# Patient Record
Sex: Male | Born: 1990 | Race: White | Hispanic: No | Marital: Single | Smoking: Current every day smoker
Health system: Southern US, Community
[De-identification: ages and names within clinical notes are randomized; demographics above are authoritative.]

## PROBLEM LIST (undated history)

## (undated) DIAGNOSIS — F411 Generalized anxiety disorder: Principal | ICD-10-CM

## (undated) DIAGNOSIS — F329 Major depressive disorder, single episode, unspecified: Secondary | ICD-10-CM

## (undated) HISTORY — DX: Generalized anxiety disorder: F41.1

## (undated) HISTORY — PX: TYMPANOSTOMY TUBE PLACEMENT: SHX32

## (undated) HISTORY — DX: Major depressive disorder, single episode, unspecified: F32.9

## (undated) HISTORY — PX: MELANOMA EXCISION: SHX5266

## (undated) NOTE — Progress Notes (Signed)
 Formatting of this note might be different from the original. Demographics, pertinent lab result, patient disposition shared by secure email with [facility] regarding reportable disease or condition, COVID-19 Diane Catherine Pomarico, RN, 12/22/2020, 11:07    Electronically signed by Diane Catherine Pomarico, RN at 12/22/2020 11:07 AM EST

## (undated) NOTE — ED Provider Notes (Signed)
 Formatting of this note is different from the original. EMERGENCY DEPARTMENT ENCOUNTER    Patient was evaluated in the emergency department today for the symptoms described in the HPI.  He/she was evaluated in the context of the global COVID-19 pandemic, which necessitated consideration that the patient might be at risk for infection with the SARS-CoV-2 virus that causes COVID-19.  Institutional protocols and algorithms that pertain to the evaluation of patients at risk for COVID-19 are in a state of rapid change based on information released by regulatory bodies including the CDC and federal and state organizations.  These policies and algorithms were followed during the patient's care in the ED.  CHIEF COMPLAINT   Chief Complaint  Patient presents with  ? Emesis    Reports N/V x2 wks.  Reports anxiety attack this AM.  Received ativan by EMS.  Reports unable to keep down trazadone.   HPI   Brian Dougherty is a 31 y.o. male who presents intermittent nausea bone for 2 refill associated with diffuse myalgias  This morning patient thinks he had a panic attack difficulty breathing, received Ativan feeling improved  Patient usually takes trazodone at night to sleep, has been unable tolerate this has not been sleeping well  Girlfriend just tested positive for COVID  PAST MEDICAL HISTORY   Past Medical History:  Diagnosis Date  ? Anxiety   ? Concussion   ? Depression   ? Insomnia   ? Melanoma [facility])    SURGICAL HISTORY   Past Surgical History:  Procedure Laterality Date  ? SKIN BIOPSY     CURRENT MEDICATIONS   No current facility-administered medications for this encounter.   Current Outpatient Medications  Medication Sig Dispense Refill  ? ondansetron (ZOFRAN-ODT) 4 MG disintegrating tablet Take 1 tablet (4 mg total) by mouth every 8 (eight) hours as needed for Nausea for up to 7 days. 20 tablet 0   ALLERGIES   Allergies  Allergen Reactions  ? Cefazolin Rash   FAMILY HISTORY    History reviewed. No pertinent family history.  SOCIAL HISTORY   Social History   Socioeconomic History  ? Marital status: Single    Spouse name: None  ? Number of children: None  ? Years of education: None  ? Highest education level: None  Occupational History  ? None  Tobacco Use  ? Smoking status: Current Every Day Smoker    Packs/day: 1.00  ? Smokeless tobacco: Never Used  Vaping Use  ? Vaping Use: Never used  Substance and Sexual Activity  ? Alcohol use: Yes    Comment: 1 drink a week  ? Drug use: Yes    Types: Marijuana  ? Sexual activity: None  Other Topics Concern  ? None  Social History Narrative  ? None   Social Determinants of Health   Financial Resource Strain: Not on file  Food Insecurity: Not on file  Transportation Needs: Not on file  Social Connections: Not on file   REVIEW OF SYSTEMS   Review of Systems  Constitutional: Negative for fever.  HENT: Negative.   Eyes: Negative.   Respiratory: Negative.   Cardiovascular: Negative.   Gastrointestinal: Positive for nausea and vomiting.  Genitourinary: Negative.   Musculoskeletal: Positive for myalgias.  Skin: Negative.   Neurological: Negative.     See HPI for further details.  PHYSICAL EXAM   VITAL SIGNS: BP 112/65   Pulse 99   Temp 98.9 F (37.2 C) (Oral)   Resp 20   Ht 6'  6 (1.981 m)   Wt 160 lb (72.6 kg)   SpO2 97%   BMI 18.49 kg/m  Constitutional:  Well developed, well nourished, no acute distress, non-toxic appearance.  Head: Atraumatic, Normocephalic Eyes:  Conjunctiva normal, no discharge ENT: surgical mask in place Neck:Normal inspection, normal range of motion,  no meningeal signs,  Respiratory:  Symmetrical chest rise, no respiratory distress,  Abdomen:   Normal abdomen exam, Soft, No tenderness, No masses, No pulsatile masses. No rebound or organomegly. Musculoskeletal/Extremities:  No edema, no cyanosis, no clubbing. Good range of motion in all major joints. No major  deformities noted.  Skin:  Warm, dry, no erythema, no rash.  Neurologic:  Awake and alert, gross normal cognition No focal deficits noted. Shoulder shrug, neck, face and tongue movement grossly normal. Psych:Appropriate affect, alert and oriented to person, place and time  GCS: 15  PROCEDURES    EKG ECG Results     ECG 12 lead (Final result)  Component (Lab Inquiry)    Collection Time Result Time Ventricular Rate Atrial Rate P-R Interval QRS Duration Q-T Interval   12/21/20 08:40:17 12/21/20 10:01:36 105 105 126 86 386    Collection Time Result Time QTC Calculation(Bezet) Calculated P Axis Calculated R Axis Calculated T Axis   12/21/20 08:40:17 12/21/20 10:01:36 510 54 75 56      Final result       Narrative:   Sinus tachycardia Otherwise normal ECG No previous ECGs available Confirmed by Wiliam Cough ([id number]) on 12/21/2020 10:01:34 AM            RADIOLOGY   Reviewed by me. See official radiology interpretation.  No orders to display   LABS  Results for orders placed or performed during the hospital encounter of 12/21/20  Respiratory Panel   Specimen: Nasopharyngeal Swab  Result Value Ref Range   Adenovirus Not Detected Not Detected   Coronavirus 229E Not Detected Not Detected   Coronavirus HKU1 Not Detected Not Detected   Coronavirus NL63 Not Detected Not Detected   Coronavirus OC43 Not Detected Not Detected   Human Metapneumovirus Not Detected Not Detected   Human Rhinovirus/Enterovirus Not Detected Not Detected   Parainfluenza Virus 1 Not Detected Not Detected   Parainfluenza Virus 2 Not Detected Not Detected   Parainfluenza Virus 3 Not Detected Not Detected   Parainfluenza Virus 4 Not Detected Not Detected   Bordetella parapertussis (IS 1001) Not Detected Not Detected   Bordetella pertussis Not Detected Not Detected   Chlamydia pneumoniae Not Detected Not Detected   Mycoplasma pneumoniae Not Detected Not Detected   Respiratory Syncytial Virus (RSV)  Not Detected Not Detected  SARS-CoV-2, Influenza A,B, RSV   Specimen: Nasopharyngeal Swab  Result Value Ref Range   SARS-CoV-2 Positive (A) Negative   SARS CoV-2 CT Value 35.1    Influenza A Negative Negative   Influenza B Negative Negative   Respiratory Syncytial Virus (RSV) Negative Negative  Comprehensive Metabolic Profile(CMP): Na, K, Cl, CO2, Creat, GFR, BUN, Glu, Ca, Alb, Tprot, Tbili, ALT, AST, ALP  Result Value Ref Range   Sodium 137 136 - 145 mmol/L   Potassium 3.2 (L) 3.4 - 5.1 mmol/L   Chloride 96 (L) 98 - 107 mmol/L   Carbon Dioxide 21.0 20.0 - 31.0 mmol/L   Anion Gap 20    Creatinine 0.86 0.70 - 1.30 mg/dL   GFR, Estimated >=39 fO/fpw/8.26f7   Urea Nitrogen, Blood 7 (L) 9 - 23 mg/dL   Glucose 82 74 - 893 mg/dL  Calcium 9.4 8.7 - 10.4 mg/dL   Albumin 4.2 3.4 - 5.0 g/dL   Total Protein 7.2 5.7 - 8.2 g/dL   Bilirubin, Total 1.1 0.3 - 1.2 mg/dL   ALT/SGPT 99 (H) 10 - 49 U/L   AST/SGOT 391 (H) <34 U/L   Alkaline Phosphatase 127 (H) 46 - 116 U/L  CBC with Auto Differential  Result Value Ref Range   WBC Count 11.5 (H) 4.0 - 10.0 K/uL   RBC Count 4.30 (L) 4.63 - 6.08 M/uL   Hemoglobin 14.6 13.7 - 17.5 g/dL   Hematocrit 58.2 59.8 - 51.0 %   MCV 97.0 (H) 79.0 - 92.2 fL   MCH 34.0 (H) 25.6 - 32.2 pg   MCHC 35.0 32.2 - 36.5 g/dL   RDW 86.9 88.3 - 85.5 %   Platelets 294 163 - 369 K/uL   MPV 11.2 9.4 - 12.4 fL   Abs NRBC 0.01 0.00 - 0.01 K/uL   Neutrophils 75.9 %   Lymphocytes 10.8 %   Monocytes 11.8 %   Eosinophils 0.7 %   Basophils 0.5 %   Immature Grans 0.3 %   NRBC 0.0 0.0 - 0.2 per 100 WBC's   Abs Neutrophils 8.72 (H) 1.60 - 6.10 K/uL   Abs Lymphs 1.24 1.20 - 3.70 K/uL   Abs Monos 1.36 (H) 0.20 - 0.80 K/uL   Abs Eosinophils 0.08 0.00 - 0.50 K/uL   Abs Basophils 0.06 0.00 - 0.10 K/uL   Abs Immature Grans 0.04 (H) 0.00 - 0.03 K/uL   ANC 8.72 K/uL   Differential Type Auto   ECG 12 lead  Result Value Ref Range   Ventricular Rate 105 BPM   Atrial Rate 105 BPM    P-R Interval 126 ms   QRS Duration 86 ms   Q-T Interval 386 ms   QTC Calculation(Bezet) 510 ms   Calculated P Axis 54 degrees   Calculated R Axis 75 degrees   Calculated T Axis 56 degrees   ORDERS  Orders Placed This Encounter  Procedures  ? Respiratory Panel  ? SARS-CoV-2, Influenza A,B, RSV  ? Extra Tubes  ? RED TOP HOLD  ? LAVENDER TOP HOLD  ? PINK TOP HOLD  ? GOLD TOP HOLD  ? DARK GREEN TOP HOLD  ? LIGHT BLUE TOP HOLD  ? LIGHT GREEN TOP HOLD  ? LIGHT GREEN TOP HOLD  ? CBC with Differential  ? Comprehensive Metabolic Profile(CMP): Na, K, Cl, CO2, Creat, GFR, BUN, Glu, Ca, Alb, Tprot, Tbili, ALT, AST, ALP  ? CBC with Auto Differential  ? Airborne Isolation + Contact 2  ? ECG 12 lead   Orders Placed This Encounter  Medications  ? sodium chloride (NS BOLUS) 0.9 % IV bolus 1,000 mL  ? droperidoL (Inapsine) injection 2.5 mg  ? sodium chloride (NS BOLUS) 0.9 % IV bolus 1,000 mL  ? ketorolac (TORADOL) 30 mg/mL (1 mL) injection 15 mg  ? ondansetron (ZOFRAN-ODT) 4 MG disintegrating tablet    Sig: Take 1 tablet (4 mg total) by mouth every 8 (eight) hours as needed for Nausea for up to 7 days.    Dispense:  20 tablet    Refill:  0   HEARTSCORE:    CC Time:  Total critical care time between repeat bedside evaluation diagnosis, interpretation of vital signs and results, treatment, management, and disposition of this patient, subtracting time for procedures was 0 minutes.  ED COURSE & MEDICAL DECISION MAKING  Pertinent labs & imaging studies reviewed by  myself. (See chart for details)  If present most recent medical visit reviewed in chart.  VITALS: BP 112/65   Pulse 99   Temp 98.9 F (37.2 C) (Oral)   Resp 20   Ht 6' 6 (1.981 m)   Wt 160 lb (72.6 kg)   SpO2 97%   BMI 18.49 kg/m   Well-appearing man no acute distress fever symptoms COVID  Extensive lab workup reassuring, patient is COVID positive Will be discharged home with antiemetics  Follow up family  doctor  Return if anything changes  FINAL IMPRESSION   Final diagnoses:  COVID-19   Portions of this note may be dictated using digital voice recognition software.  Variances in spelling and vocabulary are possible and unintentional.  Not all errors are caught/corrected.  Please notify the dino if any discrepancies are noted or if the meaning of any statement is not clear.    Donnice SQUIBB. Wiliam, DO 12/21/20 1002  Electronically signed by Donnice SQUIBB. Geib, DO at 12/21/2020 10:02 AM EST

## (undated) NOTE — ED Notes (Signed)
 Formatting of this note might be different from the original. Teaching provided regarding plan of care and pain scale - Verbalized understanding. Electronically signed by Reyes Tod Manna, RN at 12/21/2020  8:10 AM EST

---
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
## 2011-01-06 ENCOUNTER — Encounter: Payer: Self-pay | Admitting: Family Medicine

## 2011-01-17 ENCOUNTER — Encounter: Payer: Self-pay | Admitting: Neurology

## 2011-01-28 NOTE — Letter (Signed)
Summary: Consult Scheduled [facility] at [facility]  [address] [place], [place] [address]   Phone: [phone]  Fax: [phone]    10/20/2010 MRN: [id number]    Dear Mr. COWDREY,      We have scheduled an appointment for you.  At the recommendation of Dr.robert Hetty Ely, we have scheduled you a consult with Dr Sherryll Burger on Wednesday, January 11th at 2:45pm at [facility].  Their phone number is [phone].  If this appointment day and time is not convenient for you, please feel free to call the office of the doctor you are being referred to at the number listed above and reschedule the appointment.  If you have any questions, please call and speak with Carlton Everado at [phone].   Thank you,  Cyndi Bender at [facility]

## 2011-01-28 NOTE — Letter (Signed)
Summary: Dr.Shah,Neurology-Pt didn't show for appt.  Dr.Shah,Neurology-Pt didn't show for appt.   Imported By: Beau Fanny 01/07/2011 16:21:10  _____________________________________________________________________  External Attachment:    Type:   Image     Comment:   External Document

## 2011-01-28 NOTE — Progress Notes (Signed)
Summary: wants referral to neurologist  Phone Note Call from Patient   Caller: Mom  Royden Purl [phone] Summary of Call: Pt's mother says pt has been seeing the same headache specialist for years and would like to change.  She would like referral for him to see Dr Clelia Croft, a neurologist in [place].  Appt needs to be on a monday, wednesday or friday. Initial call taken by: Lowella Petties CMA,  October 13, 2010 8:48 AM  Follow-up for Phone Call        Done. Follow-up by: Shaune Leeks MD,  October 14, 2010 7:34 AM  Additional Follow-up for Phone Call Additional follow up Details #1::        Appt Dr Sherryll Burger on 01/06/2011 at 2:45pm. Carlton Thurman  October 16, 2010 10:59 AM  Additional Follow-up by: Carlton Haig,  October 16, 2010 10:59 AM

## 2011-01-28 NOTE — Consult Note (Signed)
Summary: Dr.Eliot Lewit,[facility],Note  Dr.Eliot Lewit,[facility],Note   Imported By: Beau Fanny 01/19/2010 16:42:45  _____________________________________________________________________  External Attachment:    Type:   Image     Comment:   External Document

## 2011-01-28 NOTE — Letter (Signed)
Summary: Nadara Eaton letter  [place] at [facility]  [address] [place], [place] [address]   Phone: [phone]  Fax: [phone]       07/30/2010 MRN: [id number]  [place] [address] [place], [place]  [address]  Dear Mr. [facility] and [facility] announce the retirement of Arta Silence, M.D., from full-time practice at the [facility] office effective June 25, 2010 and his plans of returning part-time.  It is important to Dr. Hetty Ely and to our practice that you understand that [facility] has seven physicians in our office for your health care needs.  We will continue to offer the same exceptional care that you have today.    Dr. Hetty Ely has spoken to many of you about his plans for retirement and returning part-time in the fall.   We will continue to work with you through the transition to schedule appointments for you in the office and meet the high standards that [facility] is committed to.   Again, it is with great pleasure that we share the news that Dr. Hetty Ely will return to [facility] in October of 2011 with a reduced schedule.    If you have any questions, or would like to request an appointment with one of our physicians, please call us at [phone] and press the option for Scheduling an appointment.  We take pleasure in providing you with excellent patient care and look forward to seeing you at your next office visit.  Our [facility] Physicians are:  Tillman Abide, M.D. Laurita Quint, M.D. Roxy Manns, M.D. Kerby Nora, M.D. Hannah Beat, M.D. Ruthe Mannan, M.D. We proudly welcomed Raechel Ache, M.D. and Eustaquio Boyden, M.D. to the practice in July/August 2011.  Sincerely,  [facility]

## 2011-01-28 NOTE — Assessment & Plan Note (Signed)
Summary: CPX / LFW   Vital Signs:  Patient profile:   20 year old male Height:      75.5 inches Weight:      136.25 pounds Temp:     98.3 degrees F oral Pulse rate:   60 / minute Pulse rhythm:   regular BP sitting:   100 / 70  (left arm) Cuff size:   regular  Vitals Entered By: Sydell Axon LPN (June 18, 2010 2:01 PM) CC: 30 Minute checkup   History of Present Illness: Pt here for Comp Exam. He has no complaints and feels well. He feels like he has something in the back of the throat for a week or more.  Preventive Screening-Counseling & Management  Alcohol-Tobacco     Alcohol drinks/day: 0     Smoking Status: never  Caffeine-Diet-Exercise     Caffeine use/day: 3      Does Patient Exercise: yes     Type of exercise: gym, lifts weight     Exercise (avg: min/session): one hour     Times/week: 3  Problems Prior to Update: 1)  Routine General Medical Exam@health  Care Facl  (ICD-V70.0) 2)  Headache  (ICD-784.0) 3)  Fracture, Wrist, Left  (ICD-814.00)  Medications Prior to Update: 1)  Advil 200 Mg  Caps (Ibuprofen) .... As Needed  Allergies: 1)  ! Biaxin 2)  ! * Cefzil  Past History:  Past Surgical History: Last updated: 02/21/2008 TUBES AS CHILD :(1995) NO IMMUNIZATION RECORDS   Family History: Last updated: 06/18/2010 Father: A 83 Mother: A 40 Brother A 23  Social History: Last updated: 06/18/2010  Parents divorced when he was in early teens. Siblings: Brother older School name: Graduated from  [employer], [place] 2011 Looking for job.  Risk Factors: Alcohol Use: 0 (06/18/2010) Caffeine Use: 3  (06/18/2010) Exercise: yes (06/18/2010)  Risk Factors: Smoking Status: never (06/18/2010)  Family History: Father: A 3 Mother: A 25 Brother A 23  Social History:  Parents divorced when he was in early teens. Siblings: Brother older School name: Graduated from  [employer], [place] 2011 Looking for job.  Review of Systems General:  Denies fever,  chills, sweats, malaise, weight loss, and sleep disorder. Eyes:  Complains of photophobia; denies blurring, diplopia, and eye pain; at times when he has heaqdaches.. ENT:  Complains of tinnitus; denies earache and decreased hearing; occas. CV:  Denies chest pains, palpitations, peripheral edema, and syncope. Resp:  Denies cough, dyspnea at rest, and wheezing. GI:  Denies nausea, vomiting, diarrhea, constipation, change in bowel habits, abdominal pain, melena, hematochezia, indigestion/heartburn, and dysphagia. GU:  Denies dysuria, discharge, and urinary frequency. MS:  Complains of joint pain; denies back pain, muscle cramps, and stiffness. Derm:  Denies rash, itching, and dryness. Neuro:  Complains of frequent headaches; denies abnormal gait and tremors; better than previously.  Physical Exam  General:  Well appearing adolescent, no acute distress, very conversant and descriptive for his age. Nontoxic, smiling, quiet and slim. Head:  normocephalic and atraumatic  Sinuses NT. Eyes:  Conjunctiva clear bilaterally.  Ears:  TM's pearly gray with normal light reflex and landmarks, canals clear except for some cerumen in the right canal. Nose:  Clear without Rhinorrhea Mouth:  Clear without erythema, edema or exudate, mucous membranes moist, mild glistening thick-looking PND. Neck:  supple without adenopathy  Chest Wall:  no deformities or breast masses noted.   Lungs:  Clear to ausc, no crackles, rhonchi or wheezing, no grunting, flaring or retractions  Heart:  RRR without murmur  Abdomen:  no masses, organomegaly, or umbilical hernia Genitalia:  normal male, testes descended bilaterally without masses, Tanner V. Msk:  no deformity or scoliosis noted with normal posture and gait for age Pulses:  pulses normal in all 4 extremities Extremities:  no cyanosis or deformity noted with normal full range of motion of all joints Neurologic:  no focal deficits, CN II-XII grossly intact with normal  reflexes, coordination, muscle strength and tone Skin:  no rash seen. Cervical Nodes:  no significant adenopathy Inguinal Nodes:  no significant adenopathy Psych:  alert and cooperative; normal mood and affect; normal attention span and concentration    Impression & Recommendations:  Problem # 1:  ROUTINE GENERAL MEDICAL EXAM@HEALTH  CARE FACL (ICD-V70.0)  routine care and anticipatory guidance for age discussed to include sex, drugs, smoking and ETOH. Has been through class for marijuana to get it off his record. Get shot record at [facility] and bring in. Tdap today.  Orders: Est. Patient 12-17 years ([address])  Problem # 2:  HEADACHE (ICD-784.0) Assessment: Improved  Not as frequent.  His updated medication list for this problem includes:    Advil 200 Mg Caps (Ibuprofen) .Marland Kitchen... As needed    Propranolol Hcl 10 Mg Tabs (Propranolol hcl) .Marland Kitchen... Take 5 by mouth daily for headaches  Problem # 3:  FRACTURE, WRIST, LEFT (ICD-814.00) Assessment: Improved Resolved, aches at times. His updated medication list for this problem includes:    Advil 200 Mg Caps (Ibuprofen) .Marland Kitchen... As needed  Medications Added to Medication List This Visit: 1)  Propranolol Hcl 10 Mg Tabs (Propranolol hcl) .... Take 5 by mouth daily for headaches  Other Orders: Tdap => 1yrs IM [phone]) Admin 1st Vaccine (09811) Admin 1st Vaccine [facility] [id number])  Patient Instructions: 1)  Bring Shot record in.Marland KitchenMarland KitchenTdap today.(he does not remember getting one in the past)  Current Allergies (reviewed today): ! BIAXIN ! * CEFZIL   Tetanus/Td Vaccine    Vaccine Type: Tdap    Site: left deltoid    Mfr: GlaxoSmithKline    Dose: 0.5 ml    Route: IM    Given by: Sydell Axon LPN    Exp. Date: 03/19/2012    Lot #: [id number]    VIS given: 11/14/07 version given June 18, 2010.

## 2011-01-28 NOTE — Consult Note (Signed)
Summary: Dr.Eliot Lewit,[facility],Note  Dr.Eliot Lewit,[facility],Note   Imported By: Beau Fanny 03/03/2010 11:29:20  _____________________________________________________________________  External Attachment:    Type:   Image     Comment:   External Document

## 2011-10-12 ENCOUNTER — Encounter: Payer: Self-pay | Admitting: Family Medicine

## 2011-10-12 ENCOUNTER — Ambulatory Visit (INDEPENDENT_AMBULATORY_CARE_PROVIDER_SITE_OTHER): Payer: BC Managed Care – PPO | Admitting: Family Medicine

## 2011-10-12 DIAGNOSIS — L259 Unspecified contact dermatitis, unspecified cause: Secondary | ICD-10-CM

## 2011-10-12 DIAGNOSIS — L309 Dermatitis, unspecified: Secondary | ICD-10-CM | POA: Insufficient documentation

## 2011-10-12 MED ORDER — DOXYCYCLINE HYCLATE 100 MG PO TABS: 100.0000 mg | ORAL_TABLET | Freq: Two times a day (BID) | ORAL | Status: AC

## 2011-10-12 NOTE — Progress Notes (Signed)
  Subjective:    Patient ID: Brian Dougherty, male    DOB: 30-Jun-1991, 20 y.o.   MRN: [id number]  HPI Pt here as acute appt for rash. He has had a rash that started as a cold sore in the angle of the mouth on the left side. He then shaved and it spread to the right side of the mouth, again in the angle and now to the surrounding cheek and chi on the right side. He denies fever or chills but has been getting hot. He has some discomfort in the submental gland area on the right side. He otherwise feels well and has no furthwer complaints.   Review of SystemsNoncontributory except as above.       Objective:   Physical Exam  Constitutional: He appears well-developed and well-nourished. No distress.  HENT:  Head: Normocephalic and atraumatic.  Right Ear: External ear normal.  Left Ear: External ear normal.  Nose: Nose normal.  Mouth/Throat: Oropharynx is clear and moist.  Eyes: Conjunctivae and EOM are normal. Pupils are equal, round, and reactive to light. Right eye exhibits no discharge. Left eye exhibits no discharge.  Neck: Normal range of motion. Neck supple.  Cardiovascular: Normal rate and regular rhythm.   Pulmonary/Chest: Effort normal and breath sounds normal. He has no wheezes.  Lymphadenopathy:    He has no cervical adenopathy.  Skin: Skin is warm and dry. Rash (individual papular slightly vessicular with erythematous halo around each on the right lower cheek and right chin with vessicular lesion in the angle of the right lip, left side healled (where it originally began).) noted. He is not diaphoretic.          Assessment & Plan:

## 2011-10-12 NOTE — Assessment & Plan Note (Signed)
Presumably bacterial dermatitis/cellulitis. Will treat with Doxy. Is allergic to multiple Abs. Call if not completely gone with two weeks of medication.

## 2012-06-09 ENCOUNTER — Ambulatory Visit: Payer: BC Managed Care – PPO | Admitting: Family Medicine

## 2012-06-09 DIAGNOSIS — Z0289 Encounter for other administrative examinations: Secondary | ICD-10-CM

## 2012-09-20 ENCOUNTER — Encounter: Payer: Self-pay | Admitting: Family Medicine

## 2012-09-20 ENCOUNTER — Ambulatory Visit (INDEPENDENT_AMBULATORY_CARE_PROVIDER_SITE_OTHER): Payer: BC Managed Care – PPO | Admitting: Family Medicine

## 2012-09-20 VITALS — BP 125/85 | HR 70 | Temp 98.0°F | Ht 77.0 in | Wt 146.5 lb

## 2012-09-20 DIAGNOSIS — S6980XA Other specified injuries of unspecified wrist, hand and finger(s), initial encounter: Secondary | ICD-10-CM

## 2012-09-20 DIAGNOSIS — IMO0002 Reserved for concepts with insufficient information to code with codable children: Secondary | ICD-10-CM

## 2012-09-20 NOTE — Progress Notes (Signed)
   [facility] at [facility] [address] [place] [address] Phone: [phone] Fax: [phone]  Date:  09/20/2012   Name:  Brian Dougherty   DOB:  [birth year]   MRN:  [id number] Gender: male Age: 21 y.o.  PCP:  Hannah Beat, MD    Chief Complaint: Hand Pain   History of Present Illness:  Brian Dougherty is a 21 y.o. pleasant patient who presents with the following:  Pleasant gentleman, who traumatized his left thumbnail 2 weeks ago, now has had some pitting and irregularities at the bases nail. He is here in the office today, and curious about what to do to make it better. He also is curious to know this is an infection or not. No fever, chills or sweats. No chest pain, shortness of breath.  Otherwise review of systems as above.  Patient Active Problem List  Diagnosis  . HEADACHE  . FRACTURE, WRIST, LEFT  . Dermatitis    No past medical history on file.  Past Surgical History  Procedure Date  . Tympanostomy tube placement     History  Substance Use Topics  . Smoking status: Current Some Day Smoker    Types: Cigars  . Smokeless tobacco: Never Used  . Alcohol Use: No    No family history on file.  Allergies  Allergen Reactions  . Cefprozil   . Clarithromycin     Medication list has been reviewed and updated.  Outpatient Prescriptions Prior to Visit  Medication Sig Dispense Refill  . Ibuprofen (ADVIL) 200 MG CAPS Take by mouth as needed.          Physical Examination: Filed Vitals:   09/20/12 1548  BP: 125/85  Pulse: 70  Temp: 98 F (36.7 C)   Filed Vitals:   09/20/12 1548  Height: 6\' 5"  (1.956 m)  Weight: 146 lb 8 oz (66.452 kg)   Body mass index is 17.37 kg/(m^2). Ideal Body Weight: Weight in (lb) to have BMI = 25: 210.4    GEN: WDWN, NAD, Non-toxic, Alert & Oriented x 3 HEENT: Atraumatic, Normocephalic.  Ears and Nose: No external deformity. EXTR: No clubbing/cyanosis/edema, left thumbnail, age is pitted and has some  wave formation in appearance. NEURO: Normal gait.  PSYCH: Normally interactive. Conversant. Not depressed or anxious appearing.  Calm demeanor.    Assessment and Plan:  1. Nail bed injury    Probable nailbed injury, cannot exclude onychomycosis.  Recommended that he just follow this, and advised against any treatment or medication. If he has some worsening of it, he certainly could have some nail fungus, and he can followup in about 6 months.  Orders Today:  No orders of the defined types were placed in this encounter.    Updated Medication List: (Includes new medications, updates to list, dose adjustments) No orders of the defined types were placed in this encounter.    Medications Discontinued: There are no discontinued medications.   Hannah Beat, MD

## 2013-01-02 ENCOUNTER — Ambulatory Visit: Payer: BC Managed Care – PPO | Admitting: Family Medicine

## 2013-01-02 DIAGNOSIS — Z0289 Encounter for other administrative examinations: Secondary | ICD-10-CM

## 2013-01-24 ENCOUNTER — Encounter: Payer: Self-pay | Admitting: Family Medicine

## 2013-01-24 ENCOUNTER — Ambulatory Visit (INDEPENDENT_AMBULATORY_CARE_PROVIDER_SITE_OTHER): Payer: BC Managed Care – PPO | Admitting: Family Medicine

## 2013-01-24 VITALS — BP 120/72 | HR 97 | Temp 98.5°F | Ht 77.0 in | Wt 143.2 lb

## 2013-01-24 DIAGNOSIS — J111 Influenza due to unidentified influenza virus with other respiratory manifestations: Secondary | ICD-10-CM

## 2013-01-24 NOTE — Progress Notes (Signed)
   Patient Name: Brian Dougherty Date of Birth: 11-09-1991 Medical Record Number: [id number] Gender: male  History of Present Illness:  Brian Dougherty presents with runny nose, sneezing, cough, sore throat, malaise, myalgias, arthralgia, chills, and fever. Feeling hot and cold, sweating last night. Sore throat. Coughing. Hurting down in chest. Runny nose.  103 temperature this morning.   Whole body hurts.   ? recent exposure to others with similar symptoms.   The patent denies sore throat as the primary complaint. Denies sthortness of breath/wheezing, otalgia, facial pain, abdominal pain, changes in bowel or bladder.  Generally feels terrible  Tmax: 103  PMH, PHS, Allergies, Problem List, Medications, Family History, and Social History have all been reviewed.  Review of Systems: as above, eating and drinking - tolerating PO. Urinating normally. No excessive vomitting or diarrhea. O/w as above.  Physical Exam:  Filed Vitals:   01/24/13 1448  BP: 120/72  Pulse: 97  Temp: 98.5 F (36.9 C)  TempSrc: Oral  Height: 6\' 5"  (1.956 m)  Weight: 143 lb 4 oz (64.978 kg)  SpO2: 96%    Gen: WDWN, NAD; A & O x3, cooperative. Pleasant.Globally Non-toxic HEENT: Normocephalic and atraumatic. Throat clear, w/o exudate, R TM clear, L TM - good landmarks, No fluid present. rhinnorhea. No frontal or maxillary sinus T. MMM NECK: Anterior cervical  LAD is present anteriorly CV: RRR, No M/G/R, cap refill <2 sec PULM: Breathing comfortably in no respiratory distress. no wheezing, crackles, rhonchi ABD: S,NT,ND,+BS. No HSM. No rebound. EXT: No c/c/e PSYCH: Friendly, good eye contact MSK: Nml gait  Assessment and Plan: 1. Influenza: The patient's clinical exam and history is consistent with a diagnosis of influenza.  Supportive care. Fluids. Cough medicines as needed  Anti-pyretics.  Infection control emphasized, including OOW or school until AF 24 hours.

## 2013-04-04 ENCOUNTER — Encounter: Payer: Self-pay | Admitting: Family Medicine

## 2013-04-04 ENCOUNTER — Ambulatory Visit (INDEPENDENT_AMBULATORY_CARE_PROVIDER_SITE_OTHER): Payer: BC Managed Care – PPO | Admitting: Family Medicine

## 2013-04-04 VITALS — BP 110/60 | HR 65 | Temp 97.9°F | Ht 77.0 in | Wt 144.5 lb

## 2013-04-04 DIAGNOSIS — F419 Anxiety disorder, unspecified: Secondary | ICD-10-CM

## 2013-04-04 DIAGNOSIS — F341 Dysthymic disorder: Secondary | ICD-10-CM

## 2013-04-04 MED ORDER — FLUOXETINE HCL 20 MG PO TABS: 20.0000 mg | ORAL_TABLET | Freq: Every day | ORAL | Status: DC

## 2013-04-04 NOTE — Progress Notes (Signed)
[name] at [facility] [address] [place] [address] Phone: [phone] Fax: [phone]  Date:  04/04/2013   Name:  Brian Dougherty   DOB:  Apr 20, 1991   MRN:  [id number] Gender: male Age: 22 y.o.  Primary Physician:  Hannah Beat, MD  Evaluating MD: Hannah Beat, MD   Chief Complaint: Anxiety   History of Present Illness:  Brian Dougherty is a 22 y.o. pleasant patient who presents with the following:  Feels anxious all the time, not eating as much. Headaches almost all the time. Had maybe an anxiety attack, heart was beating really hard. About 4 days. Negative, side of things. Feels sad and down right now. 7 months.   Not sleeping much at all. Up til 4 AM. Just lost his job - to start [employer] this summer. No other drugs.  No SI or HI.  Decreased interest.   Facial congestion.   Patient Active Problem List  Diagnosis  . HEADACHE  . FRACTURE, WRIST, LEFT  . Dermatitis    No past medical history on file.  Past Surgical History  Procedure Laterality Date  . Tympanostomy tube placement      History   Social History  . Marital Status: Single    Spouse Name: N/A    Number of Children: N/A  . Years of Education: N/A   Occupational History  . Not on file.   Social History Main Topics  . Smoking status: Current Some Day Smoker    Types: Cigars  . Smokeless tobacco: Never Used  . Alcohol Use: No  . Drug Use: No  . Sexually Active: Not on file   Other Topics Concern  . Not on file   Social History Narrative   Parents divorced when he was in early teens   Older brother   Graduated from [employer], [place], 2011    No family history on file.  Allergies  Allergen Reactions  . Cefprozil   . Clarithromycin     Medication list has been reviewed and updated.  Outpatient Prescriptions Prior to Visit  Medication Sig Dispense Refill  . Ibuprofen (ADVIL) 200 MG CAPS Take by mouth as needed.         No facility-administered  medications prior to visit.    Review of Systems:   GEN: No acute illnesses, no fevers, chills. GI: No n/v/d, eating normally Pulm: No SOB Interactive and getting along well at home.  Otherwise, ROS is as per the HPI.   Physical Examination: BP 110/60  Pulse 65  Temp(Src) 97.9 F (36.6 C) (Oral)  Ht 6\' 5"  (1.956 m)  Wt 144 lb 8 oz (65.545 kg)  BMI 17.13 kg/m2  SpO2 97%  Ideal Body Weight: Weight in (lb) to have BMI = 25: 210.4   GEN: WDWN, NAD, Non-toxic, Alert & Oriented x 3 HEENT: Atraumatic, Normocephalic.  Ears and Nose: No external deformity. EXTR: No clubbing/cyanosis/edema NEURO: Normal gait.  PSYCH: Normally interactive. Conversant. Not depressed or anxious appearing.  Calm demeanor.    Assessment and Plan:  Anxiety and depression  >25 minutes spent in face to face time with patient, >50% spent in counselling or coordination of care: Not sleeping much at all. Up til 4 AM. Just lost his job - to start [employer] this summer. No other drugs.  No SI or HI.  Decreased interest.  Discussed aerobic exercise role Start SSRI No interest in counselling  Orders Today:  No orders of the defined types were  placed in this encounter.    Updated Medication List: (Includes new medications, updates to list, dose adjustments) Meds ordered this encounter  Medications  . FLUoxetine (PROZAC) 20 MG tablet    Sig: Take 1 tablet (20 mg total) by mouth daily.    Dispense:  30 tablet    Refill:  1    Medications Discontinued: Medications Discontinued During This Encounter  Medication Reason  . Ibuprofen (ADVIL) 200 MG CAPS Error      Signed, Damaree Sargent T. Julius Matus, MD 04/04/2013 10:27 AM

## 2013-04-04 NOTE — Patient Instructions (Addendum)
F/u Dr. Patsy Lager 1 month

## 2013-04-12 ENCOUNTER — Telehealth: Payer: Self-pay

## 2013-04-12 NOTE — Telephone Encounter (Signed)
Pt started Fluoxetine 20 mg taking one daily on 04/04/13; 3 days ago pt began feeling dizzy and difficulty focusing his eyes( when he would look at object he would see 2 instead of 1). Fluoxetine did not help anxious feeling and still having h/a on and off every day. Pt did not take Fluoxetine today and has had no dizziness or problem with vision. Pt request different med sent to [facility]Please advise.Pt request call back today.

## 2013-04-13 MED ORDER — CITALOPRAM HYDROBROMIDE 20 MG PO TABS: 20.0000 mg | ORAL_TABLET | Freq: Every day | ORAL | Status: DC

## 2013-04-13 NOTE — Telephone Encounter (Signed)
Patient advised and medication sent to pharmacy  

## 2013-04-13 NOTE — Telephone Encounter (Signed)
Please call.  I am not surprised Prozac did not help --- it always takes 3-4 weeks to start working. The vision problem is unusual. I have not heard of that before, but it is listed as a rare side effect, so should be from med.  D/c Prozac Start Celexa 20 mg, 1 po qhs. #30, 2 refills

## 2013-06-09 ENCOUNTER — Emergency Department: Payer: Self-pay | Admitting: Emergency Medicine

## 2013-06-09 LAB — URINALYSIS, COMPLETE
Bacteria: NONE SEEN
Bilirubin,UR: NEGATIVE
Blood: NEGATIVE
Glucose,UR: NEGATIVE mg/dL
Ketone: NEGATIVE
Leukocyte Esterase: NEGATIVE
Nitrite: NEGATIVE
Ph: 7
Protein: NEGATIVE
RBC,UR: NONE SEEN /HPF
Specific Gravity: 1.006
Squamous Epithelial: 1
WBC UR: 1 /HPF

## 2013-06-09 LAB — DRUG SCREEN, URINE
Amphetamines, Ur Screen: NEGATIVE
Barbiturates, Ur Screen: NEGATIVE
Benzodiazepine, Ur Scrn: POSITIVE
Cannabinoid 50 Ng, Ur ~~LOC~~: POSITIVE
Cocaine Metabolite,Ur ~~LOC~~: NEGATIVE
MDMA (Ecstasy)Ur Screen: NEGATIVE
Methadone, Ur Screen: NEGATIVE
Opiate, Ur Screen: NEGATIVE
Phencyclidine (PCP) Ur S: NEGATIVE
Tricyclic, Ur Screen: NEGATIVE

## 2013-06-09 LAB — COMPREHENSIVE METABOLIC PANEL WITH GFR
Albumin: 4.8 g/dL
Alkaline Phosphatase: 87 U/L
Anion Gap: 7
BUN: 13 mg/dL
Bilirubin,Total: 0.7 mg/dL
Calcium, Total: 9.3 mg/dL
Chloride: 105 mmol/L
Co2: 26 mmol/L
Creatinine: 0.95 mg/dL
EGFR (African American): >60
EGFR (Non-African Amer.): >60
Glucose: 88 mg/dL
Osmolality: 275
Potassium: 3.8 mmol/L
SGOT(AST): 27 U/L
SGPT (ALT): 17 U/L
Sodium: 138 mmol/L
Total Protein: 7.5 g/dL

## 2013-06-09 LAB — CBC
HCT: 43.2 % (ref 40.0–52.0)
MCH: 32.2 pg (ref 26.0–34.0)
MCV: 91 fL (ref 80–100)
RDW: 12.9 % (ref 11.5–14.5)

## 2013-06-09 LAB — ETHANOL
Ethanol %: <0.003 % (ref 0.000–0.080)
Ethanol: <3 mg/dL

## 2013-06-11 ENCOUNTER — Encounter: Payer: BC Managed Care – PPO | Admitting: Family Medicine

## 2013-06-11 ENCOUNTER — Encounter: Payer: Self-pay | Admitting: Family Medicine

## 2013-06-11 ENCOUNTER — Ambulatory Visit: Payer: BC Managed Care – PPO | Admitting: Family Medicine

## 2013-06-11 DIAGNOSIS — Z0289 Encounter for other administrative examinations: Secondary | ICD-10-CM

## 2013-06-11 NOTE — Progress Notes (Signed)
This encounter was created in error - please disregard.

## 2013-06-13 ENCOUNTER — Encounter: Payer: Self-pay | Admitting: Family Medicine

## 2013-06-13 ENCOUNTER — Ambulatory Visit (INDEPENDENT_AMBULATORY_CARE_PROVIDER_SITE_OTHER): Payer: BC Managed Care – PPO | Admitting: Family Medicine

## 2013-06-13 VITALS — BP 94/64 | HR 68 | Temp 98.4°F | Wt 142.5 lb

## 2013-06-13 DIAGNOSIS — F411 Generalized anxiety disorder: Secondary | ICD-10-CM

## 2013-06-13 DIAGNOSIS — F329 Major depressive disorder, single episode, unspecified: Secondary | ICD-10-CM | POA: Insufficient documentation

## 2013-06-13 DIAGNOSIS — F4323 Adjustment disorder with mixed anxiety and depressed mood: Secondary | ICD-10-CM | POA: Insufficient documentation

## 2013-06-13 DIAGNOSIS — F32A Depression, unspecified: Secondary | ICD-10-CM

## 2013-06-13 HISTORY — DX: Generalized anxiety disorder: F41.1

## 2013-06-13 HISTORY — DX: Depression, unspecified: F32.A

## 2013-06-13 MED ORDER — CITALOPRAM HYDROBROMIDE 20 MG PO TABS: 20.0000 mg | ORAL_TABLET | Freq: Every day | ORAL | Status: DC

## 2013-06-13 NOTE — Patient Instructions (Addendum)
F/u 6 weeks or so

## 2013-06-13 NOTE — Progress Notes (Signed)
[facility] at [facility] [address] [place] [address] Phone: [phone] Fax: [phone]  Date:  06/13/2013   Name:  Brian Dougherty   DOB:  [birth year]   MRN:  [id number] Gender: male Age: 22 y.o.  Primary Physician:  Hannah Beat, MD  Evaluating MD: Hannah Beat, MD   Chief Complaint: Anxiety   History of Present Illness:  Brian Dougherty is a 22 y.o. pleasant patient who presents with the following:  F/u anxiety:   Had a bad panic attack. 2 this month. Took celexa for a month. Tolerated more.  Moved in with girlfriend. At girlfriend's parents house.  Feels a litle useless.  Mowing some yards.  Not sleeping much. Up at 8-9 AM, goes to bed 1 am usually.  Just started working out.   Patient Active Problem List   Diagnosis Date Noted  . Dermatitis 10/12/2011  . HEADACHE 12/23/2009  . FRACTURE, WRIST, LEFT 08/29/2007    No past medical history on file.  Past Surgical History  Procedure Laterality Date  . Tympanostomy tube placement      History   Social History  . Marital Status: Single    Spouse Name: N/A    Number of Children: N/A  . Years of Education: N/A   Occupational History  . Not on file.   Social History Main Topics  . Smoking status: Current Some Day Smoker -- 0.50 packs/day for 2 years    Types: Cigarettes, Cigars  . Smokeless tobacco: Former Neurosurgeon  . Alcohol Use: Yes     Comment: occassionally  . Drug Use: No  . Sexually Active: Not on file   Other Topics Concern  . Not on file   Social History Narrative   Parents divorced when he was in early teens   Older brother   Graduated from [employer], [place], 2011    No family history on file.  Allergies  Allergen Reactions  . Cefprozil   . Clarithromycin     Medication list has been reviewed and updated.  Outpatient Prescriptions Prior to Visit  Medication Sig Dispense Refill  . citalopram (CELEXA) 20 MG tablet Take 1 tablet (20 mg total) by mouth  at bedtime.  30 tablet  2   No facility-administered medications prior to visit.    Review of Systems:   GEN: No acute illnesses, no fevers, chills. GI: No n/v/d, eating normally Pulm: No SOB Interactive and getting along well at home.  Otherwise, ROS is as per the HPI.   Physical Examination: BP 94/64  Pulse 68  Temp(Src) 98.4 F (36.9 C) (Oral)  Wt 142 lb 8 oz (64.638 kg)  BMI 16.89 kg/m2  Ideal Body Weight:     GEN: WDWN, NAD, Non-toxic, Alert & Oriented x 3 HEENT: Atraumatic, Normocephalic.  Ears and Nose: No external deformity. EXTR: No clubbing/cyanosis/edema NEURO: Normal gait.  PSYCH: Normally interactive. Conversant. Flat affect.   Assessment and Plan:  Anxiety state, unspecified  Depression  >15 minutes spent in face to face time with patient, >50% spent in counselling or coordination of care: Had a bad panic attack. 2 this month. Took celexa for a month. Tolerated more.  Moved in with girlfriend. At girlfriend's parents house.  Feels a litle useless.  Mowing some yards.  Not sleeping much. Up at 8-9 AM, goes to bed 1 am usually.  Just started working out.  No SI or HI. Restart Celexa. He did not understand he was supposed to continue on  it.  No interest in counselling.  Orders Today:  No orders of the defined types were placed in this encounter.    Updated Medication List: (Includes new medications, updates to list, dose adjustments) Meds ordered this encounter  Medications  . ibuprofen (ADVIL) 200 MG tablet    Sig: Take 200 mg by mouth as needed for pain.  . citalopram (CELEXA) 20 MG tablet    Sig: Take 1 tablet (20 mg total) by mouth at bedtime.    Dispense:  30 tablet    Refill:  2    Medications Discontinued: Medications Discontinued During This Encounter  Medication Reason  . citalopram (CELEXA) 20 MG tablet Reorder      Signed, Karleen Hampshire T. Jonas Goh, MD 06/13/2013 10:56 AM

## 2013-12-28 ENCOUNTER — Ambulatory Visit: Payer: BC Managed Care – PPO | Admitting: Family Medicine

## 2014-03-13 ENCOUNTER — Ambulatory Visit (INDEPENDENT_AMBULATORY_CARE_PROVIDER_SITE_OTHER)
Admission: RE | Admit: 2014-03-13 | Discharge: 2014-03-13 | Disposition: A | Discharge status: Home / Self Care | Payer: BC Managed Care – PPO | Source: Ambulatory Visit | Attending: Family Medicine | Admitting: Family Medicine

## 2014-03-13 ENCOUNTER — Ambulatory Visit: Payer: BC Managed Care – PPO | Admitting: Family Medicine

## 2014-03-13 ENCOUNTER — Ambulatory Visit (INDEPENDENT_AMBULATORY_CARE_PROVIDER_SITE_OTHER): Payer: BC Managed Care – PPO | Admitting: Family Medicine

## 2014-03-13 ENCOUNTER — Encounter: Payer: Self-pay | Admitting: Family Medicine

## 2014-03-13 ENCOUNTER — Emergency Department: Payer: Self-pay | Admitting: Emergency Medicine

## 2014-03-13 VITALS — BP 102/68 | HR 76 | Temp 97.4°F | Ht 77.0 in | Wt 142.8 lb

## 2014-03-13 DIAGNOSIS — M79609 Pain in unspecified limb: Secondary | ICD-10-CM

## 2014-03-13 DIAGNOSIS — M542 Cervicalgia: Secondary | ICD-10-CM

## 2014-03-13 DIAGNOSIS — M79641 Pain in right hand: Secondary | ICD-10-CM

## 2014-03-13 NOTE — Progress Notes (Signed)
Pre visit review using our clinic review tool, if applicable. No additional management support is needed unless otherwise documented below in the visit note. 

## 2014-03-13 NOTE — Patient Instructions (Addendum)
Use ice whenever possible on your hand and elevate it when you can  Ibuprofen 600 mg with food up to three times per day  Be easy on your hand  We will call you with xray radiology report when it returns  Then we will make a plan  Out of work today
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
## 2014-03-14 ENCOUNTER — Telehealth: Payer: Self-pay | Admitting: Family Medicine
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
## 2014-03-14 NOTE — Telephone Encounter (Signed)
Relevant patient education assigned to patient using Emmi. ° °

## 2014-03-15 ENCOUNTER — Telehealth: Payer: Self-pay | Admitting: Family Medicine

## 2014-03-15 NOTE — Telephone Encounter (Signed)
Pt's mother is saying that her son took medication back in 693 ?? That he had a bad reaction to and is wanting to know how we could go back and find out the medication that her son took ??? Not even sure where to start on this one ?? Please advise.

## 2014-03-19 ENCOUNTER — Telehealth: Payer: Self-pay | Admitting: Family Medicine

## 2014-03-19 NOTE — Telephone Encounter (Signed)
That is ok with me  

## 2014-03-19 NOTE — Telephone Encounter (Signed)
Dr. Milinda Antisower, this is fine with me. I do not know him well, but from memory, I think he is a nice young man.

## 2014-03-19 NOTE — Telephone Encounter (Addendum)
Reviewed paper chart.  I think the medication is Biaxin which caused hallucinations.  It is on his current allery list as clarithromycin.  Left message for Diane (mom) to return my call.

## 2014-03-19 NOTE — Telephone Encounter (Signed)
Patient would like to switch PCP from [facility] to [facility]. He said he has a better rapport with Dr. Milinda Antisower.  Please advise as to your wishes.

## 2014-03-20 NOTE — Telephone Encounter (Signed)
Diane notified that the medication in question was Biaxin (clarithromycin) that caused hallucinations.

## 2014-03-22 ENCOUNTER — Ambulatory Visit (INDEPENDENT_AMBULATORY_CARE_PROVIDER_SITE_OTHER): Payer: BC Managed Care – PPO | Admitting: Internal Medicine

## 2014-03-22 ENCOUNTER — Encounter: Payer: Self-pay | Admitting: Internal Medicine

## 2014-03-22 VITALS — BP 108/68 | HR 81 | Temp 98.2°F | Wt 146.0 lb

## 2014-03-22 DIAGNOSIS — R05 Cough: Secondary | ICD-10-CM

## 2014-03-22 DIAGNOSIS — R059 Cough, unspecified: Secondary | ICD-10-CM

## 2014-03-22 DIAGNOSIS — R058 Other specified cough: Secondary | ICD-10-CM | POA: Insufficient documentation

## 2014-03-22 MED ORDER — AMOXICILLIN-POT CLAVULANATE 875-125 MG PO TABS: 1.0000 | ORAL_TABLET | Freq: Two times a day (BID) | ORAL | Status: DC

## 2014-03-22 NOTE — Assessment & Plan Note (Signed)
History was concerning for an early chronic bronchitis picture but PE suggests sinus Discussed cigarette cessation Will try augmentin

## 2014-03-22 NOTE — Progress Notes (Signed)
   Subjective:    Patient ID: Pricilla Riffledam G Taplin, male    DOB: 04-29-1991, 23 y.o.   MRN: [id number]  HPI Has been off and on sick for 3 months Now flaring again for 3-4 days Bad cough---"just can't stop myself"  Has had congestion, facial pain and pressure at times Recurrent cough---often productive of dark sputum  No fever No chills or sweats at night (though house is kept warm for his mom) Occasional SOB just sitting around  Did get seen at urgent care early in course Antibiotic didn't help (?cephalosporin)  Current Outpatient Prescriptions on File Prior to Visit  Medication Sig Dispense Refill  . ibuprofen (ADVIL) 200 MG tablet Take 200 mg by mouth as needed for pain.       No current facility-administered medications on file prior to visit.    Allergies  Allergen Reactions  . Clarithromycin     Biaxin-Caused Hallucinations  . Cefprozil     Past Medical History  Diagnosis Date  . Anxiety state, unspecified 06/13/2013  . Depression 06/13/2013    Past Surgical History  Procedure Laterality Date  . Tympanostomy tube placement      No family history on file.  History   Social History  . Marital Status: Single    Spouse Name: N/A    Number of Children: N/A  . Years of Education: N/A   Occupational History  . Not on file.   Social History Main Topics  . Smoking status: Current Some Day Smoker -- 0.50 packs/day for 2 years    Types: Cigarettes, Cigars  . Smokeless tobacco: Former NeurosurgeonUser  . Alcohol Use: Yes     Comment: occassionally  . Drug Use: No  . Sexual Activity: Not on file   Other Topics Concern  . Not on file   Social History Narrative   Parents divorced when he was in early teens   Older brother   Graduated from [employer], [place], 2011   Review of Systems No FH of COPD that he knows of No rash No vomiting or diarrhea     Objective:   Physical Exam  Constitutional: He appears well-developed and well-nourished. No distress.  HENT:    Mild maxillary tenderness Marked nasal inflammation and thick mucus 1+tonsils--some injection and exudate TMs normal  Neck: Normal range of motion. Neck supple. No thyromegaly present.  Pulmonary/Chest: Effort normal and breath sounds normal. No respiratory distress. He has no wheezes. He has no rales.  Lymphadenopathy:    He has no cervical adenopathy.          Assessment & Plan:

## 2014-03-22 NOTE — Progress Notes (Signed)
Pre visit review using our clinic review tool, if applicable. No additional management support is needed unless otherwise documented below in the visit note. 

## 2014-03-22 NOTE — Patient Instructions (Signed)
Please stop smoking completely. You can call 1-800-QUIT NOW for help

## 2014-06-26 ENCOUNTER — Ambulatory Visit: Payer: BC Managed Care – PPO | Admitting: Family Medicine

## 2014-06-26 DIAGNOSIS — Z0289 Encounter for other administrative examinations: Secondary | ICD-10-CM

## 2014-07-24 ENCOUNTER — Ambulatory Visit (INDEPENDENT_AMBULATORY_CARE_PROVIDER_SITE_OTHER): Payer: BC Managed Care – PPO | Admitting: Family Medicine

## 2014-07-24 ENCOUNTER — Encounter: Payer: Self-pay | Admitting: Family Medicine

## 2014-07-24 VITALS — BP 98/66 | HR 84 | Temp 97.9°F | Ht 77.0 in | Wt 152.0 lb

## 2014-07-24 DIAGNOSIS — F329 Major depressive disorder, single episode, unspecified: Secondary | ICD-10-CM

## 2014-07-24 DIAGNOSIS — F3289 Other specified depressive episodes: Secondary | ICD-10-CM

## 2014-07-24 DIAGNOSIS — F1910 Other psychoactive substance abuse, uncomplicated: Secondary | ICD-10-CM

## 2014-07-24 DIAGNOSIS — F411 Generalized anxiety disorder: Secondary | ICD-10-CM

## 2014-07-24 DIAGNOSIS — G4700 Insomnia, unspecified: Secondary | ICD-10-CM | POA: Insufficient documentation

## 2014-07-24 DIAGNOSIS — F1510 Other stimulant abuse, uncomplicated: Secondary | ICD-10-CM | POA: Insufficient documentation

## 2014-07-24 DIAGNOSIS — F32A Depression, unspecified: Secondary | ICD-10-CM

## 2014-07-24 MED ORDER — AMITRIPTYLINE HCL 10 MG PO TABS: ORAL_TABLET | ORAL | Status: DC

## 2014-07-24 NOTE — Assessment & Plan Note (Signed)
With anxiety and sleep disorder-see assessment for anxiety

## 2014-07-24 NOTE — Assessment & Plan Note (Signed)
7-10 sodas per day/occ coffee or tea inst to start cutting by one serving per week - until caff free -this should help headaches and also sleep

## 2014-07-24 NOTE — Assessment & Plan Note (Signed)
With sleep disorder/stressors/ depression and alcohol use/ caffeine abuse  Intol of 2 SSRIs  Reviewed stressors/ coping techniques/symptoms/ support sources/ tx options and side effects in detail today Long disc re: choices for tx plan Ref to counselor  Start amitriptyline- 10 mg and titrate to 30 mg qhs over 3 weeks - disc side eff in detail - aware if SI or neg thoughts to stop immed and call  >25 minutes spent in face to face time with patient, >50% spent in counselling or coordination of care F/u 3-4 wk

## 2014-07-24 NOTE — Patient Instructions (Signed)
Stop at check out for referral to counselor  Stop alcohol  Cut caffeine by 1 serving per week until you are caffeine free  Take a multivitamin every day  Drink smoothies to get fruits and vegetables   Start amitriptyline : 10 mg  Take 1 pill at bedtime (11-12 pm) for one week Then take 2 pills at bedtime for one week Then take 3 pills at bedtime for one week  If you are too sedated - stop at that dose  If you have intolerable side effects or if you get worse (anxiety or depression)- stop it

## 2014-07-24 NOTE — Progress Notes (Signed)
Pre visit review using our clinic review tool, if applicable. No additional management support is needed unless otherwise documented below in the visit note. 

## 2014-07-24 NOTE — Assessment & Plan Note (Signed)
With mood disorder  Will try amitriptyline 10 mg -titrate to 30 mg as tol (avoiding alcohol and starting to wean caffeine) Disc regular bed and wake times

## 2014-07-24 NOTE — Progress Notes (Signed)
Subjective:    Patient ID: Brian Dougherty, male    DOB: 1991-03-16, 23 y.o.   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 [employer]  Slept through 6 alarms -could absolutely not get up in the am  Got fired   His body naturally gets sleepy - at 5 am and 3 in the afternoon  Earliest he can go to sleep is 1 am - then has a lot of twitching if he does go to bed this early  Also nightmares  No night terrors   Caffeine - a lot of soda - at least 5-10 sodas per day / coffee occ and tea occasionally  Caffeine used to put him to seep   He got tested for ADD and ADHD -did not have it   Mood is all over the place  Has been dx with dep and anxiety - was put on celexa -only took it for 1-2 weeks -gave him negative thoughts and prozac also and headaches   Anxiety - has had it as long as he can remember - his "brain does not stop" - a big worrier about things  (thinks he is paranoid at times) , picks his nails  Perhaps a bit of OCD -not bad  Depression - gets sad over situational problems (loosing jobs and loosing friends)   He has seen a Veterinary surgeoncounselor - about 5 years ago - "told he was fine"- but a lot has changed since then It was helpful at the time  Talks to his girlfriend (a good support overall) - just got back together   Lives on his own - bills are a stressor   Alcohol - does not drink every day - probably every other day - avg - a 5th of liquor  Stays dehydrated  Does not think he has an alcohol problem  occ marijuana - it helps a lot  Also helped his headache   Diet is not optimal-eats meat and starch -no fruit or veg    Patient Active Problem List   Diagnosis Date Noted  . Insomnia 07/24/2014  . 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 Productive cough  03/22/2014  . Hand pain, right 03/13/2014  . Neck pain 03/13/2014  . Anxiety state, unspecified 06/13/2013  . Depression 06/13/2013  . FRACTURE, WRIST, LEFT 08/29/2007   Past Medical History  Diagnosis Date  . Anxiety state, unspecified 06/13/2013  . Depression 06/13/2013   Past Surgical History  Procedure Laterality Date  . Tympanostomy tube placement     History  Substance Use Topics  . Smoking status: Current Some Day Smoker -- 0.50 packs/day for 2 years    Types: Cigarettes, Cigars  . Smokeless tobacco: Former NeurosurgeonUser  . Alcohol Use: Yes     Comment: occassionally   No family history on file. Allergies  Allergen Reactions  . Clarithromycin     Biaxin-Caused Hallucinations  . Cefprozil    Current Outpatient Prescriptions on File Prior to Visit  Medication Sig Dispense Refill  . ibuprofen (ADVIL) 200 MG tablet Take 200 mg by mouth as needed for pain.       No current facility-administered medications on file prior to visit.  Review of Systems Review of Systems  Constitutional: Negative for fever, appetite change, fatigue and unexpected weight change.  Eyes: Negative for pain and visual disturbance.  Respiratory: Negative for cough and shortness of breath.   Cardiovascular: Negative for cp or palpitations    Gastrointestinal: Negative for nausea, diarrhea and constipation.  Genitourinary: Negative for urgency and frequency.  Skin: Negative for pallor or rash   Neurological: Negative for weakness, light-headedness, numbness and headaches.  Hematological: Negative for adenopathy. Does not bruise/bleed easily.  Psychiatric/Behavioral: pos for symptoms of depression and anxiety, and insomnia , neg for SI , neg for hallucinations or delusions          Objective:   Physical Exam  Constitutional: He appears well-developed and well-nourished. No distress.  Slim and well appearing   HENT:  Head: Normocephalic and atraumatic.  Mouth/Throat: Oropharynx is clear and moist.   Eyes: Conjunctivae and EOM are normal. Pupils are equal, round, and reactive to light. No scleral icterus.  Neck: Normal range of motion. Neck supple. No JVD present. Carotid bruit is not present. No thyromegaly present.  Cardiovascular: Normal rate, regular rhythm, normal heart sounds and intact distal pulses.  Exam reveals no gallop.   No murmur heard. Pulmonary/Chest: Effort normal and breath sounds normal. No respiratory distress. He has no wheezes. He has no rales.  Abdominal: Normal appearance and bowel sounds are normal. There is no hepatosplenomegaly. There is no tenderness.  Musculoskeletal: He exhibits no edema.  Lymphadenopathy:    He has no cervical adenopathy.  Neurological: He is alert. He has normal reflexes. He displays no tremor. No cranial nerve deficit. He exhibits normal muscle tone. Coordination normal.  Skin: Skin is warm and dry. No rash noted. No erythema. No pallor.  Psychiatric: His behavior is normal. Thought content normal. His mood appears anxious. His affect is not blunt, not labile and not inappropriate. Thought content is not paranoid. Cognition and memory are normal. He exhibits a depressed mood. He expresses no homicidal and no suicidal ideation.  Pleasant and attentive and talkative Candid about stressors and symptoms Not tearful  Supportive girlfriend with him           Assessment & Plan:   Problem List Items Addressed This Visit     Other   Anxiety state, unspecified     With sleep disorder/stressors/ depression and alcohol use/ caffeine abuse  Intol of 2 SSRIs  Reviewed stressors/ coping techniques/symptoms/ support sources/ tx options and side effects in detail today Long disc re: choices for tx plan Ref to counselor  Start amitriptyline- 10 mg and titrate to 30 mg qhs over 3 weeks - disc side eff in detail - aware if SI or neg thoughts to stop immed and call  >25 minutes spent in face to face time with patient, >50% spent in counselling or  coordination of care F/u 3-4 wk       Relevant Medications      amitriptyline (ELAVIL) tablet   Other Relevant Orders      Ambulatory referral to Psychology   Depression - Primary     With anxiety and sleep disorder-see assessment for anxiety    Relevant Medications      amitriptyline (ELAVIL) tablet   Other Relevant Orders      Ambulatory referral to Psychology   Insomnia     With mood disorder  Will try amitriptyline 10 mg -titrate to 30 mg as tol (avoiding alcohol and starting to wean caffeine) Disc regular bed and  wake times      Relevant Orders      Ambulatory referral to Psychology   Caffeine abuse     7-10 sodas per day/occ coffee or tea inst to start cutting by one serving per week - until caff free -this should help headaches and also sleep

## 2014-08-22 ENCOUNTER — Ambulatory Visit (INDEPENDENT_AMBULATORY_CARE_PROVIDER_SITE_OTHER): Payer: BC Managed Care – PPO | Admitting: Psychology

## 2014-08-22 ENCOUNTER — Telehealth: Payer: Self-pay | Admitting: Family Medicine

## 2014-08-22 DIAGNOSIS — F4323 Adjustment disorder with mixed anxiety and depressed mood: Secondary | ICD-10-CM

## 2014-08-22 NOTE — Telephone Encounter (Signed)
This lists Brian Dougherty as the pt - is that correct -it came under a different name ?

## 2014-08-22 NOTE — Telephone Encounter (Signed)
Spoke with Brian Dougherty with [facility]; on 08/21/14 pt was working on car and tried to turn rusty bolt and had severe shoulder pain; pt can't move shoulder and is in constant pain. Brian Dougherty is not sure why pt cannot take tylenol or ibuprofen.  Was advised by front office that pts mother was on the phone; finished telling Brian Dougherty that Dr Ermalene Searing suggested UC or ED if in constant pain and cannot move shoulder; pt may need scans or other testing beside a shoulder xray. If pt does not want to go to [facility] or ED can leave appt with Dr Ermalene Searing for 08/23/14 at 10:45 AM. When I went to talk with the mother there was no one on the phone; I called the contact # in the chart and spoke with pt; pt said he was tired and wanted to keep appt with Dr Ermalene Searing 08/23/14. Pt said he does not want a long wait in the ED. Pt said if condition changed or worsened prior to appt he would go to UC or ED. Pt said I could call his mother Brian Dougherty at [phone], I did speak with Ms Brian Dougherty and advised the above info. Pt spoke with Brian Dougherty about scheduling concerns.

## 2014-08-22 NOTE — Telephone Encounter (Signed)
Patient Information:  Caller Name: Brian Dougherty  Phone: [phone]  Patient: Brian Dougherty, Brian Dougherty  Gender: Male  DOB: 04/19/1964  Age: 23 Years  PCP: Aron, Talia (Family Practice)  Pregnant: No  Office Follow Up:  Does the office need to follow up with this patient?: No  Instructions For The Office: N/A  RN Note:  Advised pt. to go to the ED now. Pt. states she cannot do that. She is a single mother with 2 children and will wait  until 08/23/14 to be seen. Has an appt. scheduled at 13:00 with the NP.  Symptoms  Reason For Call & Symptoms: Went into the office 2 weeks ago for sinus congestion and chest congestion., Rx  Augmentin 875mg. BID x 10 days. Now continues to cough and have phlegm in throat. Having chest pain on the Lt.  side. No difficulty breathing. States she had this in the past and ended up on a stronger antibiotic and steroids.  Reviewed Health History In EMR: Yes  Reviewed Medications In EMR: Yes  Reviewed Allergies In EMR: Yes  Reviewed Surgeries / Procedures: Yes  Date of Onset of Symptoms: 08/06/2014  Treatments Tried: Augmentin  Treatments Tried Worked: Yes  OB / GYN:  LMP: 08/21/2014  Guideline(s) Used:  Cough  Disposition Per Guideline:  Go to ED Now  Reason For Disposition Reached:  Chest pain present when not coughing  Advice Given:  Call Back If:  Difficulty breathing occurs  You become worse  Patient Will Follow Care Advice:  YES  Triage Document  To [facility] (Daytime  Triage)  [address] Fax [phone]  [place], [place] [address]  p. [phone]  f. [phone]  Taken by Karen Howard, RN Date 8/27/201515:46:00  

## 2014-08-22 NOTE — Telephone Encounter (Signed)
Patient Information: Caller Name: Graciella Belton Phone: [phone] Patient: Brian Dougherty, Brian Dougherty Gender: Male DOB: 1991/05/24 Age: 23 Years PCP: [facility] [place] (Family Practice)  Office Follow Up: Does the office need to follow up with this patient?: No Instructions For The Office: N/A   Symptoms Reason For Call & Symptoms: 08/21/14 1600 pt was working on car and was trying to undo a rusted bolt using all his force and had sudden onset of severe pain/injured his right shoulder.  08/22/14 pt is unable to move shoulder at all without intense pain.  Pt is in severe pain constantly.   Tylenol and Ibuprofen do not work for him so he has been icing arm for relief.  Disposition: ER or office with PCP approval.   Spoke with Rena, LPN in office regarding pt's sxs, she spoke with Dr Ermalene Searing who advised pt could wait till tomorrow for appt if they preferred but her recommendation would be for pt to go to [facility] UC for further evaluation today and if needed they could refer him to ER, but unfortunately no Dr's had any availability in the office today.    Advised mom of these recommendations, she states she will talk to pt about options, but requests to leave appt for now on 08/23/14 and she will call back if they need to cancel it. Reviewed Health History In EMR: Yes Reviewed Medications In EMR: Yes Reviewed Allergies In EMR: Yes Reviewed Surgeries / Procedures: Yes Date of Onset of Symptoms: 08/22/2014  Guideline(s) Used: Shoulder Injury  Disposition Per Guideline:   Go to ED Now (or to Office with PCP Approval)  Reason For Disposition Reached:   Can't move injured shoulder at all  Advice Given: N/A  Patient Will Follow Care Advice: YES

## 2014-08-23 ENCOUNTER — Ambulatory Visit (INDEPENDENT_AMBULATORY_CARE_PROVIDER_SITE_OTHER)
Admission: RE | Admit: 2014-08-23 | Discharge: 2014-08-23 | Disposition: A | Discharge status: Home / Self Care | Payer: BC Managed Care – PPO | Source: Ambulatory Visit | Attending: Family Medicine | Admitting: Family Medicine

## 2014-08-23 ENCOUNTER — Ambulatory Visit (INDEPENDENT_AMBULATORY_CARE_PROVIDER_SITE_OTHER): Payer: BC Managed Care – PPO | Admitting: Family Medicine

## 2014-08-23 ENCOUNTER — Encounter: Payer: Self-pay | Admitting: Family Medicine

## 2014-08-23 VITALS — BP 100/80 | HR 70 | Temp 98.2°F | Ht 77.0 in | Wt 150.2 lb

## 2014-08-23 DIAGNOSIS — M25519 Pain in unspecified shoulder: Secondary | ICD-10-CM

## 2014-08-23 DIAGNOSIS — M25512 Pain in left shoulder: Secondary | ICD-10-CM | POA: Insufficient documentation

## 2014-08-23 MED ORDER — HYDROCODONE-ACETAMINOPHEN 5-325 MG PO TABS: 1.0000 | ORAL_TABLET | Freq: Four times a day (QID) | ORAL | Status: DC | PRN

## 2014-08-23 NOTE — Patient Instructions (Addendum)
Referral to [facility] TODAY for left shoulder pain, concern for dislocation.  Wear arm in sling.  Start  vicodin for pain as needed.

## 2014-08-23 NOTE — Progress Notes (Signed)
Pre visit review using our clinic review tool, if applicable. No additional management support is needed unless otherwise documented below in the visit note. 

## 2014-08-23 NOTE — Addendum Note (Signed)
Addended by: Kerby Nora E on: 08/23/2014 01:34 PM   Modules accepted: Orders

## 2014-08-23 NOTE — Assessment & Plan Note (Signed)
Initial read of X-ray show no fracture bu anterior placement of hummerus. Will send to radiology for overread. Pt with joint instability history, concern for possible dislocation anteriorly with resulting injury to shoulder jint.  Wil treat with sling, vicodin for pain and ASAP referral to [facility].  Pt with numbness in hand on right.. ? Injury to bracial plexus.

## 2014-08-23 NOTE — Telephone Encounter (Signed)
Spoke with pts mother and she said all was taken care of now for her son.  We had offered an appt to her son with Nicki Reaper, but he did not want to see a NP.  Also, we did not ask for $60 from the patient.  We only told him he would owe his copay which is $25 for his visit.  He must have been told he owed $60 to [employer] in [place]. Not for a primary care visit.

## 2014-08-23 NOTE — Progress Notes (Addendum)
   Subjective:    Patient ID: Brian Dougherty, male    DOB: 03-09-1991, 23 y.o.   MRN: [id number]  Shoulder Pain  Pertinent negatives include no fever.   23 year old male presents with new onset severe pain in last 2 days.  He was working on his car pulling on ratchet he had sudden pain in his left shoulder No redness. Shoulder is swollen. He cannot move it without pain. Most comfortable position is bent across lap. He has been wearing a sling.  No fall. No pop, no click. He has always able to pop shoulders out of joint. He has tried tylenol without relief.      Review of Systems  Constitutional: Negative for fever and fatigue.  HENT: Negative for ear pain.   Eyes: Negative for pain.  Respiratory: Negative for cough and shortness of breath.   Cardiovascular: Negative for chest pain.       Objective:   Physical Exam  Constitutional: Vital signs are normal. He appears well-developed and well-nourished.  HENT:  Head: Normocephalic.  Right Ear: Hearing normal.  Left Ear: Hearing normal.  Nose: Nose normal.  Mouth/Throat: Oropharynx is clear and moist and mucous membranes are normal.  Neck: Trachea normal. Carotid bruit is not present. No mass and no thyromegaly present.  Cardiovascular: Normal rate, regular rhythm and normal pulses.  Exam reveals no gallop, no distant heart sounds and no friction rub.   No murmur heard. No peripheral edema  Pulmonary/Chest: Effort normal and breath sounds normal. No respiratory distress.  Musculoskeletal:       Left shoulder: He exhibits decreased range of motion, tenderness, bony tenderness, swelling and effusion.       Cervical back: He exhibits tenderness. He exhibits normal range of motion and no bony tenderness.  Pt holding arm in int rotation, large effusion over anterior shoulder Diffuse tenderness to palp in neck anterior shoulder, upper arm.  Skin: Skin is warm, dry and intact. No rash noted.  Psychiatric: He has a normal mood and  affect. His speech is normal and behavior is normal. Thought content normal.          Assessment & Plan:

## 2014-08-27 NOTE — Telephone Encounter (Signed)
Documentation was entered on wrong patient.  [facility] healthlink was notified and I was instructed that note cannot be removed.

## 2014-08-29 ENCOUNTER — Ambulatory Visit (INDEPENDENT_AMBULATORY_CARE_PROVIDER_SITE_OTHER): Payer: BC Managed Care – PPO | Admitting: Psychology

## 2014-08-29 DIAGNOSIS — F4322 Adjustment disorder with anxiety: Secondary | ICD-10-CM

## 2014-09-05 ENCOUNTER — Ambulatory Visit (INDEPENDENT_AMBULATORY_CARE_PROVIDER_SITE_OTHER): Payer: BC Managed Care – PPO | Admitting: Psychology

## 2014-09-05 ENCOUNTER — Other Ambulatory Visit: Payer: Self-pay

## 2014-09-05 DIAGNOSIS — F4323 Adjustment disorder with mixed anxiety and depressed mood: Secondary | ICD-10-CM

## 2014-09-05 MED ORDER — HYDROCODONE-ACETAMINOPHEN 5-325 MG PO TABS: 1.0000 | ORAL_TABLET | Freq: Four times a day (QID) | ORAL | Status: DC | PRN

## 2014-09-05 NOTE — Telephone Encounter (Signed)
Please find out what ortho thinks is going on with his shoulder. Get note etc.  I see he has upcoming MRI.  Will prescribe pain med temporarily.

## 2014-09-05 NOTE — Telephone Encounter (Signed)
Pts mother said pt is in severe pain and out of pain med and request rx Hydrocodone apap. Call when ready for pick up. Pt has been at another dr appt today and that is reason for waiting until 4:50 pm to call for rx.Pt pain level now is 7 but earlier was 10. Pt has MRI on MOn but needs pain med until sees what is going on.Please advise.

## 2014-09-06 NOTE — Telephone Encounter (Signed)
Did call pt's mother last night and she was aware Rx was ready for pick up--Rx was picked up

## 2014-09-06 NOTE — Telephone Encounter (Addendum)
Left message at [facility] requesting office notes for [name].

## 2014-09-09 ENCOUNTER — Ambulatory Visit: Payer: Self-pay | Admitting: Orthopedic Surgery

## 2014-09-10 ENCOUNTER — Telehealth: Payer: Self-pay

## 2014-09-10 ENCOUNTER — Telehealth: Payer: Self-pay | Admitting: Family Medicine

## 2014-09-10 NOTE — Telephone Encounter (Signed)
Pt called inquiring about pt (her son) getting a refill on Norco--pt picked up Rx from Dr Ermalene Searing on the evening of 09/06/14--per Dr Ermalene Searing this was only temporary until pt goes to [place] myself and Hospital doctor told pt that we are unable to speak with her as she is not listed on her HIPAA-- Called pt and he is aware that per Dr Ermalene Searing his Norco Rx was for temporary relief and will no longer provide Rx as he is supposed to see orth--pt voiced understanding and hung up phone

## 2014-09-10 NOTE — Telephone Encounter (Signed)
Pt's mother called for the pt asking for more pain meds.  We told her we could not go into a conversation about the pt because she was not listed on his DPR and that he needed to call about his pain meds.  Dr. Ermalene Searing is not allowing a refill because he was referred to Ortho.  Pt's mother was very angry that we would not refill Jaeshaun's medication and was screaming at [facility], CMA.  Shawna Orleans put her on hold.  I picked up the call and she continued screaming at me about how they have been customers for 30 years and she doesn't know why we won't help her child and all he needs is pain medication.  I told her that I could not discuss what Dr. Ermalene Searing had to say about his arm because she was not on the [facility]. I told her she can make an appt for a recheck if needed.  She said Dr. Ermalene Searing needs to call her son and talk to him.  If Dr. Ermalene Searing requires an appt then they will make an appt.  Pt's mother would not back down on this at all.
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
## 2014-09-12 ENCOUNTER — Ambulatory Visit: Payer: BC Managed Care – PPO | Admitting: Psychology

## 2014-09-12 NOTE — Telephone Encounter (Signed)
Tried to call pt to give message.  No answer and no voicemail on primary number.  The second number was his mother's voicemail so I did not leave a message.

## 2014-09-18 ENCOUNTER — Telehealth: Payer: Self-pay

## 2014-09-18 DIAGNOSIS — M25512 Pain in left shoulder: Secondary | ICD-10-CM

## 2014-09-18 NOTE — Telephone Encounter (Signed)
Called pt in reference to mom's call this morning.  Pt wants to be referred back to [facility] where we originally scheduled him at 3:00 pm 08/23/14 (see referral notes, pt cancelled that appointment and went to [facility] 08/27/14).  [place] will be picking up CD's at [facility] (MRI) [employer] and our office (X-rays) and taking to Dr. Ave Filter 09/20/14 Fri 12:00 arrival.  Pt states he did not want to return to Dr. Martha Clan, states Dr. would not listen to him.  Explained in detail that pt will need to complete a new DPR if he wants [place] to communicate with his mom.  He will complete when he picks up x-rays.

## 2014-09-18 NOTE — Telephone Encounter (Signed)
Brian Dougherty pts mother left v/m; Brian Dougherty just spoke with [facility] and [facility] does not have referral from Dr Ermalene Searing sent on 08/23/14. Brian Dougherty request referral sent today [facility] fax # [phone]; Brian Dougherty wants pt to have appt with shoulder specialist on 09/19/14; pt continuing with shoulder pain for 5 weeks. Brian Dougherty request cb. Brian Dougherty notified.

## 2014-09-19 NOTE — Telephone Encounter (Signed)
Referral sent 

## 2014-09-20 ENCOUNTER — Telehealth: Payer: Self-pay | Admitting: Family Medicine

## 2014-09-20 ENCOUNTER — Encounter: Payer: Self-pay | Admitting: Family Medicine

## 2014-09-20 ENCOUNTER — Ambulatory Visit (INDEPENDENT_AMBULATORY_CARE_PROVIDER_SITE_OTHER): Payer: BC Managed Care – PPO | Admitting: Family Medicine

## 2014-09-20 VITALS — BP 118/62 | HR 62 | Temp 97.7°F | Ht 78.0 in | Wt 148.8 lb

## 2014-09-20 DIAGNOSIS — M25519 Pain in unspecified shoulder: Secondary | ICD-10-CM

## 2014-09-20 DIAGNOSIS — M25512 Pain in left shoulder: Secondary | ICD-10-CM

## 2014-09-20 NOTE — Telephone Encounter (Signed)
Pt is on Dr. Lianne Bushy schedule today at 2:15.

## 2014-09-20 NOTE — Telephone Encounter (Signed)
Patient Information:  Caller Name: Diane  Phone: [phone]  Patient: Brian Dougherty, Brian Dougherty  Gender: Male  DOB: 01-27-1991  Age: 23 Years  PCP: Kerby Nora (Family Practice)  Office Follow Up:  Does the office need to follow up with this patient?: Yes  Instructions For The Office: Plan of care for arm pain  RN Note:  Mom calling regarding child/[place].  Seen at Orthopedist today and advised "No injury noted to explain pain child is experiencing."  Advised to take Advil for pain relief.  Mom states limb continues to be painful.  Desires a call back regarding plan of care.  Symptoms  Reason For Call & Symptoms: plan of care acute left shoulder pain  Reviewed Health History In EMR: Yes  Reviewed Medications In EMR: Yes  Reviewed Allergies In EMR: Yes  Reviewed Surgeries / Procedures: Yes  Date of Onset of Symptoms: 08/23/2014  Guideline(s) Used:  No Protocol Available - Information Only  Disposition Per Guideline:   Discuss with PCP and Callback by Nurse Today  Reason For Disposition Reached:   Nursing judgment  Advice Given:  Call Back If:  You become worse.  Patient Will Follow Care Advice:  YES
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
## 2014-09-20 NOTE — Patient Instructions (Signed)
I'll check with Dr. Milinda Antis and we'll await your records.  Take care.
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
## 2014-12-05 ENCOUNTER — Ambulatory Visit (INDEPENDENT_AMBULATORY_CARE_PROVIDER_SITE_OTHER): Payer: BC Managed Care – PPO | Admitting: Psychology

## 2014-12-05 DIAGNOSIS — F4323 Adjustment disorder with mixed anxiety and depressed mood: Secondary | ICD-10-CM

## 2015-01-02 ENCOUNTER — Ambulatory Visit (INDEPENDENT_AMBULATORY_CARE_PROVIDER_SITE_OTHER): Payer: BC Managed Care – PPO | Admitting: Psychology

## 2015-01-02 DIAGNOSIS — F4323 Adjustment disorder with mixed anxiety and depressed mood: Secondary | ICD-10-CM

## 2015-01-22 ENCOUNTER — Ambulatory Visit (INDEPENDENT_AMBULATORY_CARE_PROVIDER_SITE_OTHER): Payer: BC Managed Care – PPO | Admitting: Family Medicine

## 2015-01-22 ENCOUNTER — Encounter: Payer: Self-pay | Admitting: Family Medicine

## 2015-01-22 VITALS — BP 116/72 | HR 70 | Temp 97.8°F | Ht 78.0 in | Wt 144.8 lb

## 2015-01-22 DIAGNOSIS — F4323 Adjustment disorder with mixed anxiety and depressed mood: Secondary | ICD-10-CM

## 2015-01-22 MED ORDER — ALPRAZOLAM 0.5 MG PO TABS: 0.5000 mg | ORAL_TABLET | Freq: Every evening | ORAL | Status: AC | PRN

## 2015-01-22 NOTE — Progress Notes (Signed)
Subjective:    Patient ID: Brian Dougherty, male    DOB: 04-28-1991, 24 y.o.   MRN: [id number]  HPI Here for panic attacks (his mother found out he is having them)  He tried the medicine- amitriptyline - more panic attacks on 10 mg  Stopped it   Did cut back on alcohol  (drinking a lot less- occ beer)  Caffeine - 1-2 cans of soda per day  Not smoking at all  Drinking more water   He did see Merchandiser, retailTerri Bauert- going well (has worked on directing him away from Sales executivealcohol/caff)  He has not told her about panic attacks   Wt is down 4 lb  bmi of 16 " I haven't eaten in 2 days"  When he gets food in front of him - it makes him nauseated   Still not sleeping   Panic attacks about 7-10 per month  His heart beat goes fast/ breathes fast and gets nauseated / hands feel funny at the tips of his fingers  In general they last about 15 minutes  Unsure if he has a trigger - most of the time they happen at night when trying to go to sleep  Makes him feel afraid   Lost his car/house and job within a month  Lots of stress  Lost his job because he lost his car/ transportation  Had to move home - that increased his anxiety  Did have an interview this am - and his mom will help with tranport    Patient Active Problem List   Diagnosis Date Noted  . Acute pain of left shoulder 08/23/2014  . Insomnia 07/24/2014  . 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 Adjustment disorder with mixed anxiety and depressed mood 06/13/2013  . Depression 06/13/2013  . FRACTURE, WRIST, LEFT 08/29/2007   Past Medical History  Diagnosis Date  . Anxiety state, unspecified 06/13/2013  . Depression 06/13/2013   Past Surgical History  Procedure Laterality Date  . Tympanostomy tube placement     History  Substance Use Topics  . Smoking status: Former Smoker -- 0.50 packs/day for 2 years    Types: Cigarettes, Cigars    Quit date: 01/06/2015  . Smokeless tobacco: Former NeurosurgeonUser  . Alcohol Use: 0.0 oz/week    0 Not specified  per week     Comment: occassionally   No family history on file. Allergies  Allergen Reactions  . Clarithromycin     Biaxin-Caused Hallucinations  . Cefprozil    Current Outpatient Prescriptions on File Prior to Visit  Medication Sig Dispense Refill  . ibuprofen (ADVIL) 200 MG tablet Take 200 mg by mouth as needed for pain.    Marland Kitchen. amitriptyline (ELAVIL) 10 MG tablet Take 3 pills by mouth at bedtime as directed (Patient not taking: Reported on 01/22/2015) 90 tablet 1  . HYDROcodone-acetaminophen (NORCO/VICODIN) 5-325 MG per tablet Take 1 tablet by mouth every 6 (six) hours as needed for moderate pain. (Patient not taking: Reported on 01/22/2015) 30 tablet 0  . Naproxen Sodium (ALEVE) 220 MG CAPS Take 1 capsule by mouth as needed.     No current facility-administered medications on file prior to visit.        Review of Systems Review of Systems  Constitutional: Negative for fever, appetite change,  and unexpected weight change. pos for fatigue  Eyes: Negative for pain and visual disturbance.  Respiratory: Negative for cough and shortness of breath.   Cardiovascular: Negative for cp or palpitations    Gastrointestinal:  Negative for nausea, diarrhea and constipation.  Genitourinary: Negative for urgency and frequency.  Skin: Negative for pallor or rash   Neurological: Negative for weakness, light-headedness, numbness and headaches.  Hematological: Negative for adenopathy. Does not bruise/bleed easily.  Psychiatric/Behavioral: pos for symptoms of depression/ anxiety and panic disorder, neg for SI        Objective:   Physical Exam  Constitutional: He is oriented to person, place, and time. He appears well-developed and well-nourished. No distress.  Underweight and well app  HENT:  Head: Normocephalic and atraumatic.  Mouth/Throat: Oropharynx is clear and moist.  Eyes: Conjunctivae and EOM are normal. Pupils are equal, round, and reactive to light. No scleral icterus.  Neck: Normal  range of motion. Neck supple. No JVD present. No thyromegaly present.  Cardiovascular: Normal rate, regular rhythm and normal heart sounds.   Pulmonary/Chest: Effort normal and breath sounds normal. No respiratory distress. He has no wheezes. He has no rales.  Abdominal: Soft. Bowel sounds are normal. He exhibits no distension and no mass. There is no tenderness.  Musculoskeletal: He exhibits no edema.  Lymphadenopathy:    He has no cervical adenopathy.  Neurological: He is alert and oriented to person, place, and time. He has normal reflexes. He displays tremor. No cranial nerve deficit. He exhibits normal muscle tone. Coordination normal.  Mild hand tremor   Skin: Skin is warm and dry. No rash noted. No erythema. No pallor.  Psychiatric: His speech is normal and behavior is normal. Thought content normal. His mood appears anxious. His affect is not blunt, not labile and not inappropriate. Thought content is not paranoid. Cognition and memory are normal. He exhibits a depressed mood. He expresses no homicidal and no suicidal ideation.  Anxious / jittery Pleasant and does discuss his stressors           Assessment & Plan:   Problem List Items Addressed This Visit      Other   Adjustment disorder with mixed anxiety and depressed mood - Primary    Having a rough time with frequent panic attacks Doing well with counseling and has almost eliminated caffeine and alcohol- commended on this  He has failed 2 SSRIs and also amitriptyline  Not sleeping and poor appetite /underwt  Reviewed stressors/ coping techniques/symptoms/ support sources/ tx options and side effects in detail today  No SI Will ref to psychiatrist for further eval and treatment  >25 minutes spent in face to face time with patient, >50% spent in counselling or coordination of care       Relevant Orders   Ambulatory referral to Psychiatry

## 2015-01-22 NOTE — Progress Notes (Signed)
Pre visit review using our clinic review tool, if applicable. No additional management support is needed unless otherwise documented below in the visit note. 

## 2015-01-22 NOTE — Patient Instructions (Signed)
Try xanax at bedtime for sleep as needed - watch out for sedation / do not drive with it or mix with alcohol  Since you have had trouble with other medicines - I am referring you to psychiatry for help with medication  Continue working on avoiding caffeine and alcohol as much as you can  Continue counseling  Just stop up front for referral to psychiatry

## 2015-01-23 NOTE — Assessment & Plan Note (Signed)
Having a rough time with frequent panic attacks Doing well with counseling and has almost eliminated caffeine and alcohol- commended on this  He has failed 2 SSRIs and also amitriptyline  Not sleeping and poor appetite /underwt  Reviewed stressors/ coping techniques/symptoms/ support sources/ tx options and side effects in detail today  No SI Will ref to psychiatrist for further eval and treatment  >25 minutes spent in face to face time with patient, >50% spent in counselling or coordination of care

## 2015-04-22 IMAGING — CR ORBITS FOR FOREIGN BODY - 2 VIEW
1 series · 2 of 2 positions shown · non-contrast
Comparison: None.

CLINICAL DATA: Metal working/exposure; clearance prior to MRI

EXAM:
ORBITS FOR FOREIGN BODY - 2 VIEW

[Series 1: dxr orbits for mri clearance · 0.14mm/px · 2 of 2 slices shown]
[im 1/2]
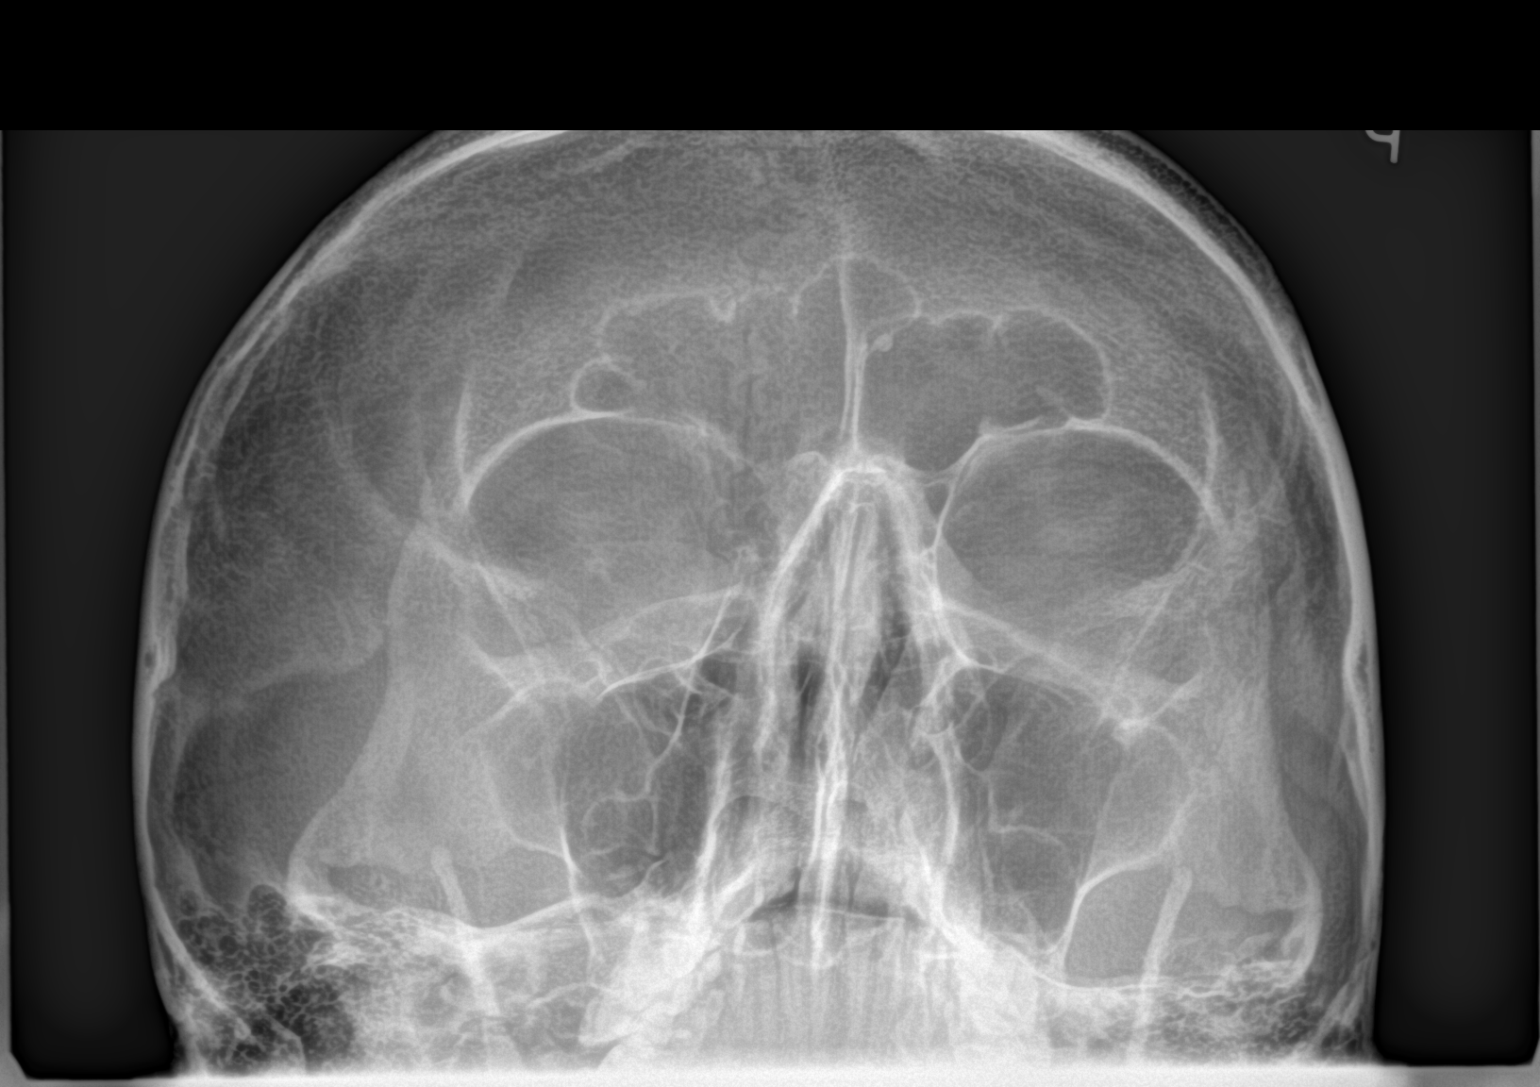
[im 2/2]
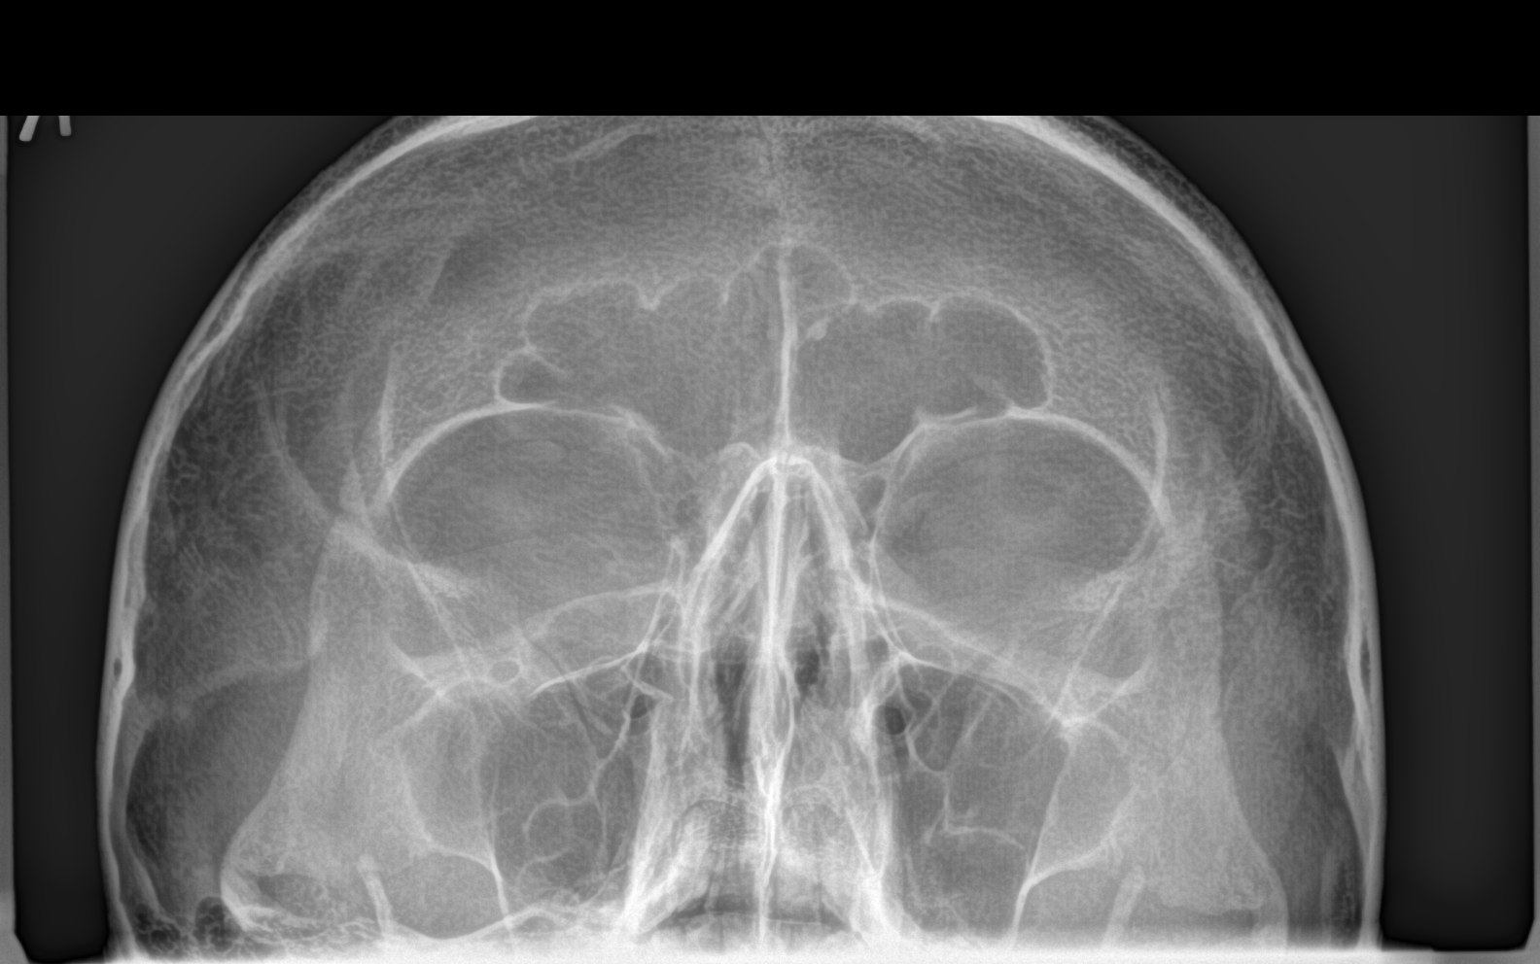

[2 of 2 positions shown; findings below may reference images not displayed]

FINDINGS: There is no evidence of metallic foreign body within the orbits. No
significant bone abnormality identified.
IMPRESSION: No evidence of metallic foreign body within the orbits.

## 2015-11-19 ENCOUNTER — Encounter: Payer: Self-pay | Admitting: Emergency Medicine

## 2015-11-19 ENCOUNTER — Emergency Department: Payer: BC Managed Care – PPO

## 2015-11-19 ENCOUNTER — Emergency Department
Admission: EM | Admit: 2015-11-19 | Discharge: 2015-11-19 | Disposition: A | Discharge status: Home / Self Care | Payer: BC Managed Care – PPO | Attending: Emergency Medicine | Admitting: Emergency Medicine

## 2015-11-19 DIAGNOSIS — Y9289 Other specified places as the place of occurrence of the external cause: Secondary | ICD-10-CM | POA: Insufficient documentation

## 2015-11-19 DIAGNOSIS — F17210 Nicotine dependence, cigarettes, uncomplicated: Secondary | ICD-10-CM | POA: Diagnosis not present

## 2015-11-19 DIAGNOSIS — S161XXA Strain of muscle, fascia and tendon at neck level, initial encounter: Secondary | ICD-10-CM | POA: Diagnosis not present

## 2015-11-19 DIAGNOSIS — Z79899 Other long term (current) drug therapy: Secondary | ICD-10-CM | POA: Diagnosis not present

## 2015-11-19 DIAGNOSIS — Y9351 Activity, roller skating (inline) and skateboarding: Secondary | ICD-10-CM | POA: Insufficient documentation

## 2015-11-19 DIAGNOSIS — S199XXA Unspecified injury of neck, initial encounter: Secondary | ICD-10-CM | POA: Diagnosis present

## 2015-11-19 DIAGNOSIS — S0083XA Contusion of other part of head, initial encounter: Secondary | ICD-10-CM | POA: Insufficient documentation

## 2015-11-19 DIAGNOSIS — S0081XA Abrasion of other part of head, initial encounter: Secondary | ICD-10-CM | POA: Insufficient documentation

## 2015-11-19 DIAGNOSIS — Y998 Other external cause status: Secondary | ICD-10-CM | POA: Diagnosis not present

## 2015-11-19 DIAGNOSIS — S00212A Abrasion of left eyelid and periocular area, initial encounter: Secondary | ICD-10-CM | POA: Insufficient documentation

## 2015-11-19 MED ORDER — METHOCARBAMOL 500 MG PO TABS: 500.0000 mg | ORAL_TABLET | Freq: Four times a day (QID) | ORAL | Status: AC

## 2015-11-19 MED ORDER — NAPROXEN 500 MG PO TABS: 500.0000 mg | ORAL_TABLET | Freq: Two times a day (BID) | ORAL | Status: AC

## 2015-11-19 NOTE — ED Provider Notes (Signed)
[facility] Emergency Department Provider Note  ____________________________________________  Time seen: Approximately 6:52 PM  I have reviewed the triage vital signs and the nursing notes.   HISTORY  Chief Complaint Abrasion and Neck Pain    HPI Brian Dougherty is a 24 y.o. male who presents to the emergency department status post a skateboard accident yesterday. He states that he was skateboarding down a hill when his body fell in front of him causing him to "fly forward and landed on my face. He is now complaining of abrasions to left side of his face, swelling around his left eye, and increased pain to his left cheek. He denies any loss of consciousness. He does endorse some neck pain on the right side "mainly in the muscles." He denies any other injuries or complaints at this time. He states the pain in his face is sharp, constant, not relieved by anything.Endorses a mild headache, no visual acuity changes, no serosanguineous drainage from nose or ears, no chest pain, shortness of breath, lower back pain.   Past Medical History  Diagnosis Date  . Anxiety state, unspecified 06/13/2013  . Depression 06/13/2013    Patient Active Problem List   Diagnosis Date Noted  . Acute pain of left shoulder 08/23/2014  . Insomnia 07/24/2014  . 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 Adjustment disorder with mixed anxiety and depressed mood 06/13/2013  . Depression 06/13/2013  . FRACTURE, WRIST, LEFT 08/29/2007    Past Surgical History  Procedure Laterality Date  . Tympanostomy tube placement    . Melanoma excision      Current Outpatient Rx  Name  Route  Sig  Dispense  Refill  . ALPRAZolam (XANAX) 0.5 MG tablet   Oral   Take 1 tablet (0.5 mg total) by mouth at bedtime as needed for anxiety.   30 tablet   1   . ibuprofen (ADVIL) 200 MG tablet   Oral   Take 200 mg by mouth as needed for pain.         . methocarbamol (ROBAXIN) 500 MG tablet   Oral   Take 1  tablet (500 mg total) by mouth 4 (four) times daily.   16 tablet   0   . naproxen (NAPROSYN) 500 MG tablet   Oral   Take 1 tablet (500 mg total) by mouth 2 (two) times daily with a meal.   60 tablet   2   . Naproxen Sodium (ALEVE) 220 MG CAPS   Oral   Take 1 capsule by mouth as needed.           Allergies Clarithromycin; Amitriptyline; and Cefprozil  No family history on file.  Social History Social History  Substance Use Topics  . Smoking status: Current Every Day Smoker -- 1.00 packs/day for 2 years    Types: Cigarettes, Cigars    Last Attempt to Quit: 01/06/2015  . Smokeless tobacco: Former Neurosurgeon  . Alcohol Use: 0.0 oz/week    0 Standard drinks or equivalent per week     Comment: occassionally    Review of Systems Constitutional: No fever/chills Eyes: No visual changes. ENT: No sore throat. Cardiovascular: Denies chest pain. Respiratory: Denies shortness of breath. Gastrointestinal: No abdominal pain.  No nausea, no vomiting.  No diarrhea.  No constipation. Genitourinary: Negative for dysuria. Musculoskeletal: Negative for back pain. Endorses left cheek pain. Skin: Negative for rash. Nurse's abrasions to left side of his face. Neurological: Negative for headaches, focal weakness or numbness.  10-point ROS  otherwise negative.  ____________________________________________   PHYSICAL EXAM:  VITAL SIGNS: ED Triage Vitals  Enc Vitals Group     BP 11/19/15 1843 124/87 mmHg     Pulse Rate 11/19/15 1843 78     Resp 11/19/15 1843 18     Temp 11/19/15 1843 98 F (36.7 C)     Temp Source 11/19/15 1843 Oral     SpO2 11/19/15 1843 99 %     Weight 11/19/15 1843 165 lb (74.844 kg)     Height 11/19/15 1843 6\' 5"  (1.956 m)     Head Cir --      Peak Flow --      Pain Score 11/19/15 1844 7     Pain Loc --      Pain Edu? --      Excl. in GC? --     Constitutional: Alert and oriented. Well appearing and in no acute distress. Eyes: Conjunctivae are normal.  PERRL. EOMI. Head: Abrasions are noted to periocular area including left forehead, left cheek. Edema noted inferior to left eye along the zygomatic arch. Tenderness to palpation zygomatic arch. No crepitus noted to palpation of the face. No palpable for many. Nose: No congestion/rhinnorhea. Mouth/Throat: Mucous membranes are moist.  Oropharynx non-erythematous. Neck: No stridor.  No cervical spine tenderness to palpation. Diffuse tenderness to palpation over the right-sided paraspinal muscle group. Cardiovascular: Normal rate, regular rhythm. Grossly normal heart sounds.  Good peripheral circulation. Respiratory: Normal respiratory effort.  No retractions. Lungs CTAB. Gastrointestinal: Soft and nontender. No distention. No abdominal bruits. No CVA tenderness. Musculoskeletal: No lower extremity tenderness nor edema.  No joint effusions. Neurologic:  Normal speech and language. No gross focal neurologic deficits are appreciated. No gait instability. Skin:  Skin is warm, dry and intact. No rash noted. Psychiatric: Mood and affect are normal. Speech and behavior are normal.  ____________________________________________   LABS (all labs ordered are listed, but only abnormal results are displayed)  Labs Reviewed - No data to display ____________________________________________  EKG   ____________________________________________  RADIOLOGY  CT maxillofacial Impression: Soft tissue swelling overlying the left maxilla/zygomatic arch without underlying facial fracture. ____________________________________________   PROCEDURES  Procedure(s) performed: None  Critical Care performed: No  ____________________________________________   INITIAL IMPRESSION / ASSESSMENT AND PLAN / ED COURSE  Pertinent labs & imaging results that were available during my care of the patient were reviewed by me and considered in my medical decision making (see chart for details).  Agents history,  symptoms, physical exam are taken into consideration for diagnosis. CT scan was ordered due to tenderness to palpation over the zygomatic arch with associated swelling, abrasion, and hematoma. CT scan reveals no acute osseous abnormality. Patient's symptoms are consistent with abrasion to the face as well as ecchymosis. I advised patient of findings and diagnosis and he verbalizes understanding. The patient will be given anti-inflammatories and muscle relaxers for her symptomatically control. ____________________________________________   FINAL CLINICAL IMPRESSION(S) / ED DIAGNOSES  Final diagnoses:  Facial abrasion, initial encounter  Facial contusion, initial encounter  Cervical muscle strain, initial encounter      Racheal PatchesJonathan D Kaimana Neuzil, PA-C 11/19/15 1959  Jennye MoccasinBrian S Quigley, MD 11/19/15 2021

## 2015-11-19 NOTE — ED Notes (Signed)
Patient presents to the ED with abrasion to his face and neck pain after having a skateboarding accident at 1am last night.  Patient denies losing consciousness, denies dizziness and vomiting.  Patient reports headache.

## 2015-11-19 NOTE — Discharge Instructions (Signed)
Abrasion An abrasion is a cut or scrape on the outer surface of your skin. An abrasion does not extend through all of the layers of your skin. It is important to care for your abrasion properly to prevent infection. CAUSES Most abrasions are caused by falling on or gliding across the ground or another surface. When your skin rubs on something, the outer and inner layer of skin rubs off.  SYMPTOMS A cut or scrape is the main symptom of this condition. The scrape may be bleeding, or it may appear red or pink. If there was an associated fall, there may be an underlying bruise. DIAGNOSIS An abrasion is diagnosed with a physical exam. TREATMENT Treatment for this condition depends on how large and deep the abrasion is. Usually, your abrasion will be cleaned with water and mild soap. This removes any dirt or debris that may be stuck. An antibiotic ointment may be applied to the abrasion to help prevent infection. A bandage (dressing) may be placed on the abrasion to keep it clean. You may also need a tetanus shot. HOME CARE INSTRUCTIONS Medicines  Take or apply medicines only as directed by your health care provider.  If you were prescribed an antibiotic ointment, finish all of it even if you start to feel better. Wound Care  Clean the wound with mild soap and water 2-3 times per day or as directed by your health care provider. Pat your wound dry with a clean towel. Do not rub it.  There are many different ways to close and cover a wound. Follow instructions from your health care provider about:  Wound care.  Dressing changes and removal.  Check your wound every day for signs of infection. Watch for:  Redness, swelling, or pain.  Fluid, blood, or pus. General Instructions  Keep the dressing dry as directed by your health care provider. Do not take baths, swim, use a hot tub, or do anything that would put your wound underwater until your health care provider approves.  If there is  swelling, raise (elevate) the injured area above the level of your heart while you are sitting or lying down.  Keep all follow-up visits as directed by your health care provider. This is important. SEEK MEDICAL CARE IF:  You received a tetanus shot and you have swelling, severe pain, redness, or bleeding at the injection site.  Your pain is not controlled with medicine.  You have increased redness, swelling, or pain at the site of your wound. SEEK IMMEDIATE MEDICAL CARE IF:  You have a red streak going away from your wound.  You have a fever.  You have fluid, blood, or pus coming from your wound.  You notice a bad smell coming from your wound or your dressing.   This information is not intended to replace advice given to you by your health care provider. Make sure you discuss any questions you have with your health care provider.   Document Released: 09/22/2005 Document Revised: 09/03/2015 Document Reviewed: 12/11/2014 Elsevier Interactive Patient Education 2016 [employer]  Cervical Sprain A cervical sprain is an injury in the neck in which the strong, fibrous tissues (ligaments) that connect your neck bones stretch or tear. Cervical sprains can range from mild to severe. Severe cervical sprains can cause the neck vertebrae to be unstable. This can lead to damage of the spinal cord and can result in serious nervous system problems. The amount of time it takes for a cervical sprain to get better depends on  the cause and extent of the injury. Most cervical sprains heal in 1 to 3 weeks. CAUSES  Severe cervical sprains may be caused by:   Contact sport injuries (such as from football, rugby, wrestling, hockey, auto racing, gymnastics, diving, martial arts, or boxing).   Motor vehicle collisions.   Whiplash injuries. This is an injury from a sudden forward and backward whipping movement of the head and neck.  Falls.  Mild cervical sprains may be caused by:   Being in an  awkward position, such as while cradling a telephone between your ear and shoulder.   Sitting in a chair that does not offer proper support.   Working at a poorly Marketing executive station.   Looking up or down for long periods of time.  SYMPTOMS   Pain, soreness, stiffness, or a burning sensation in the front, back, or sides of the neck. This discomfort may develop immediately after the injury or slowly, 24 hours or more after the injury.   Pain or tenderness directly in the middle of the back of the neck.   Shoulder or upper back pain.   Limited ability to move the neck.   Headache.   Dizziness.   Weakness, numbness, or tingling in the hands or arms.   Muscle spasms.   Difficulty swallowing or chewing.   Tenderness and swelling of the neck.  DIAGNOSIS  Most of the time your health care provider can diagnose a cervical sprain by taking your history and doing a physical exam. Your health care provider will ask about previous neck injuries and any known neck problems, such as arthritis in the neck. X-rays may be taken to find out if there are any other problems, such as with the bones of the neck. Other tests, such as a CT scan or MRI, may also be needed.  TREATMENT  Treatment depends on the severity of the cervical sprain. Mild sprains can be treated with rest, keeping the neck in place (immobilization), and pain medicines. Severe cervical sprains are immediately immobilized. Further treatment is done to help with pain, muscle spasms, and other symptoms and may include:  Medicines, such as pain relievers, numbing medicines, or muscle relaxants.   Physical therapy. This may involve stretching exercises, strengthening exercises, and posture training. Exercises and improved posture can help stabilize the neck, strengthen muscles, and help stop symptoms from returning.  HOME CARE INSTRUCTIONS   Put ice on the injured area.   Put ice in a plastic bag.   Place a  towel between your skin and the bag.   Leave the ice on for 15-20 minutes, 3-4 times a day.   If your injury was severe, you may have been given a cervical collar to wear. A cervical collar is a two-piece collar designed to keep your neck from moving while it heals.  Do not remove the collar unless instructed by your health care provider.  If you have long hair, keep it outside of the collar.  Ask your health care provider before making any adjustments to your collar. Minor adjustments may be required over time to improve comfort and reduce pressure on your chin or on the back of your head.  Ifyou are allowed to remove the collar for cleaning or bathing, follow your health care provider's instructions on how to do so safely.  Keep your collar clean by wiping it with mild soap and water and drying it completely. If the collar you have been given includes removable pads, remove them every  1-2 days and hand wash them with soap and water. Allow them to air dry. They should be completely dry before you wear them in the collar.  If you are allowed to remove the collar for cleaning and bathing, wash and dry the skin of your neck. Check your skin for irritation or sores. If you see any, tell your health care provider.  Do not drive while wearing the collar.   Only take over-the-counter or prescription medicines for pain, discomfort, or fever as directed by your health care provider.   Keep all follow-up appointments as directed by your health care provider.   Keep all physical therapy appointments as directed by your health care provider.   Make any needed adjustments to your workstation to promote good posture.   Avoid positions and activities that make your symptoms worse.   Warm up and stretch before being active to help prevent problems.  SEEK MEDICAL CARE IF:   Your pain is not controlled with medicine.   You are unable to decrease your pain medicine over time as planned.    Your activity level is not improving as expected.  SEEK IMMEDIATE MEDICAL CARE IF:   You develop any bleeding.  You develop stomach upset.  You have signs of an allergic reaction to your medicine.   Your symptoms get worse.   You develop new, unexplained symptoms.   You have numbness, tingling, weakness, or paralysis in any part of your body.  MAKE SURE YOU:   Understand these instructions.  Will watch your condition.  Will get help right away if you are not doing well or get worse.   This information is not intended to replace advice given to you by your health care provider. Make sure you discuss any questions you have with your health care provider.   Document Released: 10/10/2007 Document Revised: 12/18/2013 Document Reviewed: 06/20/2013 Elsevier Interactive Patient Education [employer]

## 2015-11-19 NOTE — ED Notes (Signed)
Pt presents with abrasions to left eye, cheek and chin. Pt reports having a skateboard accident last night. Pt reports neck pain as a 6/10. No acute distress noted.
